# Patient Record
Sex: Male | Born: 1955 | Race: White | Hispanic: No | Smoking: Never smoker
Health system: Southern US, Community
[De-identification: ages and names within clinical notes are randomized; demographics above are authoritative.]

## PROBLEM LIST (undated history)

## (undated) DIAGNOSIS — I2699 Other pulmonary embolism without acute cor pulmonale: Secondary | ICD-10-CM

## (undated) DIAGNOSIS — F419 Anxiety disorder, unspecified: Secondary | ICD-10-CM

## (undated) DIAGNOSIS — Z889 Allergy status to unspecified drugs, medicaments and biological substances status: Secondary | ICD-10-CM

## (undated) DIAGNOSIS — I10 Essential (primary) hypertension: Secondary | ICD-10-CM

## (undated) HISTORY — PX: MOUTH SURGERY: SHX715

---
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
## 2007-09-05 ENCOUNTER — Ambulatory Visit: Payer: Self-pay | Admitting: Cardiology

## 2010-11-14 NOTE — Letter (Signed)
September 05, 2007    Dr. Charlesetta Shanks   RE:  AYVIN, LIPINSKI  MRN:  [id number]  /  DOB:  Feb 20, 1956   Dear Dr. Celene Skeen:   Thank you for your referral of Mr. Tagg.  He is a 55 year old male  with a history of possible polymyalgia rheumatica presently being  treated with prednisone.  He has no personal history of cardiovascular  disease, hypertension or type 2 diabetes mellitus, but does report a  problem with borderline cholesterol elevation which he has been trying  to manage via diet.  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  He developed a flu-like illness  associated with dizziness which sounded fairly orthostatic based on his  description.  A few weeks later when he was walking his wife to work,  approximately 400-500 yards, he began to experience a chest ache also  associated with milder dizziness and cold sweats.  He had symptoms  intermittently for 1-2 hours and then this resolved spontaneously.  He  did not seek medical attention at that time.  Subsequent to this he has  noted intermittently dyspnea on exertion somewhat out of proportion to  activity, typically with walking or going up steps.  He has had no chest  pain, however.  He is referred to [place] today discuss the matter further.  I note electrocardiogram done recently which does not show any evidence  of transmural infarction.  He had one tracing showing a single premature  atrial complex.  He has never undergone any prior ischemic evaluation.   ALLERGIES:  AMPICILLIN   Present medications include prednisone 10 mg p.o. daily and over-the-  counter antihistamines preparation.   PAST MEDICAL HISTORY:  As discussed above.  Patient is status post  vasectomy in 2007 and prior knee surgery in 1973.  He also has problems  with chronic left rotator cuff pain.   SOCIAL HISTORY:  The patient is married.  He has two stepchildren.  He  denies any tobacco use.  Drinks  alcohol occasionally.  No regular  exercise regimen at this time.  He is a Community education officer at  [employer].   FAMILY HISTORY:  Was reviewed and is noncontributory for premature  cardiovascular disease.  The patient states his mother died at age 76  and with history of breast cancer and father died at age 76 with  leukemia.   REVIEW OF SYSTEMS:  As outlined above.  He has problems with seasonal  allergies.  Muscle and joint stiffness also described.  No fevers or  chills.  No orthopnea, palpitations or syncope.  Remainder of systems  are negative.   EXAMINATION:  Blood pressure is 118/74 at rest, heart rate 89.  The  patient was checked and found not to be orthostatic based on blood  pressure response.  Heart rate increased somewhat into the high 90s to  low 100 range but he was asymptomatic.  He is normally nourished  appearing, no acute distress.  HEENT:  Conjunctiva, lids normal.  Pharynx clear.  NECK is supple.  No elevated jugular venous pressure, no bruits, no  thyromegaly is noted.  LUNGS:  Clear, without labored breathing at rest.  CARDIAC exam reveals a regular rate and rhythm.  No S3 gallop or  pericardial rub.  ABDOMEN:  Soft, nontender, normoactive bowel sounds.  EXTREMITIES:  Exhibit no significant pitting edema.  Distal pulses are  2+.  SKIN:  Warm  and dry.  MUSCULOSKELETAL:  No kyphosis is noted.  NEUROPSYCHIATRIC:  The patient is alert and oriented x3.  Affect is  normal.   IMPRESSION AND RECOMMENDATIONS:  Mr. Star is a 55 year old male with  reported borderline hypercholesterolemia who experienced an episode of  exertional chest discomfort and subsequently a feeling of relative  dyspnea on exertion.  He also had a flu-like illness proceeding these  symptoms back in February.  He has undergone no prior cardiac risk  stratification.  His electrocardiogram at rest is nonspecific.  We  discussed the issues today and I have recommended an exercise   echocardiogram to both exclude cardiomyopathy and also evaluate for  potential ischemia.  We will plan to contact him with the results by  phone.  If any significant abnormalities are uncovered we will bring him  back to discuss further testing.    Sincerely,     Jonelle Sidle, MD  Electronically Signed   SGM/MedQ  DD: 09/05/2007  DT: 09/06/2007  Job #: [id number]

## 2015-10-31 DIAGNOSIS — I2699 Other pulmonary embolism without acute cor pulmonale: Secondary | ICD-10-CM

## 2015-10-31 HISTORY — DX: Other pulmonary embolism without acute cor pulmonale: I26.99

## 2016-11-07 ENCOUNTER — Emergency Department (HOSPITAL_COMMUNITY): Payer: 59

## 2016-11-07 ENCOUNTER — Encounter (HOSPITAL_COMMUNITY): Payer: Self-pay | Admitting: Emergency Medicine

## 2016-11-07 ENCOUNTER — Inpatient Hospital Stay (HOSPITAL_COMMUNITY)
Admission: EM | Admit: 2016-11-07 | Discharge: 2016-11-09 | DRG: 176 | Disposition: A | Discharge status: Home / Self Care | Payer: 59 | Attending: Family Medicine | Admitting: Family Medicine

## 2016-11-07 DIAGNOSIS — Z881 Allergy status to other antibiotic agents status: Secondary | ICD-10-CM

## 2016-11-07 DIAGNOSIS — Z6826 Body mass index (BMI) 26.0-26.9, adult: Secondary | ICD-10-CM

## 2016-11-07 DIAGNOSIS — Z79899 Other long term (current) drug therapy: Secondary | ICD-10-CM

## 2016-11-07 DIAGNOSIS — F419 Anxiety disorder, unspecified: Secondary | ICD-10-CM | POA: Diagnosis present

## 2016-11-07 DIAGNOSIS — R0902 Hypoxemia: Secondary | ICD-10-CM | POA: Diagnosis present

## 2016-11-07 DIAGNOSIS — S2231XA Fracture of one rib, right side, initial encounter for closed fracture: Secondary | ICD-10-CM | POA: Diagnosis present

## 2016-11-07 DIAGNOSIS — R634 Abnormal weight loss: Secondary | ICD-10-CM | POA: Diagnosis present

## 2016-11-07 DIAGNOSIS — R000 Tachycardia, unspecified: Secondary | ICD-10-CM

## 2016-11-07 DIAGNOSIS — I2699 Other pulmonary embolism without acute cor pulmonale: Principal | ICD-10-CM | POA: Diagnosis present

## 2016-11-07 DIAGNOSIS — Z882 Allergy status to sulfonamides status: Secondary | ICD-10-CM

## 2016-11-07 DIAGNOSIS — X58XXXA Exposure to other specified factors, initial encounter: Secondary | ICD-10-CM | POA: Diagnosis present

## 2016-11-07 DIAGNOSIS — R0600 Dyspnea, unspecified: Secondary | ICD-10-CM | POA: Diagnosis not present

## 2016-11-07 DIAGNOSIS — R0609 Other forms of dyspnea: Secondary | ICD-10-CM | POA: Diagnosis not present

## 2016-11-07 DIAGNOSIS — I10 Essential (primary) hypertension: Secondary | ICD-10-CM | POA: Diagnosis present

## 2016-11-07 HISTORY — DX: Allergy status to unspecified drugs, medicaments and biological substances: Z88.9

## 2016-11-07 HISTORY — DX: Essential (primary) hypertension: I10

## 2016-11-07 HISTORY — DX: Other pulmonary embolism without acute cor pulmonale: I26.99

## 2016-11-07 HISTORY — DX: Anxiety disorder, unspecified: F41.9

## 2016-11-07 LAB — CBC WITH DIFFERENTIAL/PLATELET
BASOS PCT: 1 %
Basophils Absolute: 0.1 10*3/uL (ref 0.0–0.1)
Eosinophils Absolute: 1 10*3/uL — ABNORMAL HIGH (ref 0.0–0.7)
Eosinophils Relative: 8 %
HEMATOCRIT: 43.3 % (ref 39.0–52.0)
HEMOGLOBIN: 15.4 g/dL (ref 13.0–17.0)
LYMPHS ABS: 1.5 10*3/uL (ref 0.7–4.0)
Lymphocytes Relative: 13 %
MCH: 31.3 pg (ref 26.0–34.0)
MCHC: 35.6 g/dL (ref 30.0–36.0)
MCV: 88 fL (ref 78.0–100.0)
MONO ABS: 0.6 10*3/uL (ref 0.1–1.0)
MONOS PCT: 5 %
NEUTROS ABS: 8.6 10*3/uL — AB (ref 1.7–7.7)
NEUTROS PCT: 73 %
Platelets: 275 10*3/uL (ref 150–400)
RBC: 4.92 MIL/uL (ref 4.22–5.81)
RDW: 13 % (ref 11.5–15.5)
WBC: 11.8 10*3/uL — ABNORMAL HIGH (ref 4.0–10.5)

## 2016-11-07 LAB — HEPARIN LEVEL (UNFRACTIONATED): Heparin Unfractionated: 0.75 IU/mL — ABNORMAL HIGH (ref 0.30–0.70)

## 2016-11-07 LAB — COMPREHENSIVE METABOLIC PANEL
ALK PHOS: 65 U/L (ref 38–126)
ALT: 18 U/L (ref 17–63)
AST: 22 U/L (ref 15–41)
Albumin: 4.1 g/dL (ref 3.5–5.0)
Anion gap: 7 (ref 5–15)
BILIRUBIN TOTAL: 1.1 mg/dL (ref 0.3–1.2)
BUN: 13 mg/dL (ref 6–20)
CO2: 28 mmol/L (ref 22–32)
CREATININE: 1.2 mg/dL (ref 0.61–1.24)
Calcium: 9.1 mg/dL (ref 8.9–10.3)
Chloride: 102 mmol/L (ref 101–111)
GFR calc non Af Amer: >60 mL/min (ref >60)
GLUCOSE: 117 mg/dL — AB (ref 65–99)
Potassium: 4.4 mmol/L (ref 3.5–5.1)
Sodium: 137 mmol/L (ref 135–145)
Total Protein: 7 g/dL (ref 6.5–8.1)

## 2016-11-07 LAB — TSH: TSH: 0.431 u[IU]/mL (ref 0.350–4.500)

## 2016-11-07 LAB — I-STAT TROPONIN, ED: Troponin i, poc: 0.01 ng/mL (ref 0.00–0.08)

## 2016-11-07 LAB — MAGNESIUM: Magnesium: 2.2 mg/dL (ref 1.7–2.4)

## 2016-11-07 LAB — MRSA PCR SCREENING: MRSA by PCR: NEGATIVE

## 2016-11-07 LAB — BRAIN NATRIURETIC PEPTIDE: B NATRIURETIC PEPTIDE 5: 39.7 pg/mL (ref 0.0–100.0)

## 2016-11-07 LAB — RAPID URINE DRUG SCREEN, HOSP PERFORMED
Amphetamines: NOT DETECTED
Barbiturates: NOT DETECTED
Benzodiazepines: NOT DETECTED
COCAINE: NOT DETECTED
OPIATES: POSITIVE — AB
Tetrahydrocannabinol: NOT DETECTED

## 2016-11-07 LAB — PHOSPHORUS: PHOSPHORUS: 2.5 mg/dL (ref 2.5–4.6)

## 2016-11-07 LAB — STREP PNEUMONIAE URINARY ANTIGEN: STREP PNEUMO URINARY ANTIGEN: NEGATIVE

## 2016-11-07 MED ORDER — HEPARIN (PORCINE) IN NACL 100-0.45 UNIT/ML-% IJ SOLN
1500.0000 [IU]/h | INTRAMUSCULAR | Status: DC
  Administered 2016-11-07: 1600 [IU]/h via INTRAVENOUS
  Administered 2016-11-08: 1500 [IU]/h via INTRAVENOUS
  Filled 2016-11-07 (×2): qty 250

## 2016-11-07 MED ORDER — ONDANSETRON HCL 4 MG PO TABS: 4.0000 mg | ORAL_TABLET | Freq: Four times a day (QID) | ORAL | Status: DC | PRN

## 2016-11-07 MED ORDER — METHYLPREDNISOLONE SODIUM SUCC 125 MG IJ SOLR
60.0000 mg | Freq: Three times a day (TID) | INTRAMUSCULAR | Status: DC
  Administered 2016-11-07 – 2016-11-08 (×3): 60 mg via INTRAVENOUS
  Filled 2016-11-07 (×3): qty 2

## 2016-11-07 MED ORDER — METHYLPREDNISOLONE SODIUM SUCC 125 MG IJ SOLR
125.0000 mg | Freq: Once | INTRAMUSCULAR | Status: AC
  Administered 2016-11-07: 125 mg via INTRAVENOUS
  Filled 2016-11-07: qty 2

## 2016-11-07 MED ORDER — ONDANSETRON HCL 4 MG/2ML IJ SOLN: 4.0000 mg | Freq: Four times a day (QID) | INTRAMUSCULAR | Status: DC | PRN

## 2016-11-07 MED ORDER — IPRATROPIUM-ALBUTEROL 0.5-2.5 (3) MG/3ML IN SOLN
3.0000 mL | RESPIRATORY_TRACT | Status: DC | PRN
  Administered 2016-11-07 – 2016-11-08 (×2): 3 mL via RESPIRATORY_TRACT
  Filled 2016-11-07 (×2): qty 3

## 2016-11-07 MED ORDER — HEPARIN BOLUS VIA INFUSION
5500.0000 [IU] | Freq: Once | INTRAVENOUS | Status: AC
  Administered 2016-11-07: 5500 [IU] via INTRAVENOUS
  Filled 2016-11-07: qty 5500

## 2016-11-07 MED ORDER — LORAZEPAM 2 MG/ML IJ SOLN
1.0000 mg | Freq: Once | INTRAMUSCULAR | Status: AC
  Administered 2016-11-07: 1 mg via INTRAVENOUS
  Filled 2016-11-07: qty 1

## 2016-11-07 MED ORDER — SODIUM CHLORIDE 0.9 % IV BOLUS (SEPSIS)
1000.0000 mL | Freq: Once | INTRAVENOUS | Status: AC
  Administered 2016-11-07: 1000 mL via INTRAVENOUS

## 2016-11-07 MED ORDER — BENZONATATE 100 MG PO CAPS
100.0000 mg | ORAL_CAPSULE | Freq: Three times a day (TID) | ORAL | Status: DC | PRN
  Administered 2016-11-07 – 2016-11-09 (×5): 100 mg via ORAL
  Filled 2016-11-07 (×5): qty 1

## 2016-11-07 MED ORDER — IPRATROPIUM-ALBUTEROL 0.5-2.5 (3) MG/3ML IN SOLN
3.0000 mL | Freq: Once | RESPIRATORY_TRACT | Status: AC
  Administered 2016-11-07: 3 mL via RESPIRATORY_TRACT
  Filled 2016-11-07: qty 3

## 2016-11-07 MED ORDER — ACETAMINOPHEN 325 MG PO TABS
650.0000 mg | ORAL_TABLET | Freq: Four times a day (QID) | ORAL | Status: DC | PRN
  Administered 2016-11-08: 650 mg via ORAL
  Filled 2016-11-07: qty 2

## 2016-11-07 MED ORDER — ALBUTEROL (5 MG/ML) CONTINUOUS INHALATION SOLN
10.0000 mg/h | INHALATION_SOLUTION | Freq: Once | RESPIRATORY_TRACT | Status: AC
  Administered 2016-11-07: 10 mg/h via RESPIRATORY_TRACT

## 2016-11-07 MED ORDER — ALBUTEROL (5 MG/ML) CONTINUOUS INHALATION SOLN
10.0000 mg/h | INHALATION_SOLUTION | Freq: Once | RESPIRATORY_TRACT | Status: AC
  Administered 2016-11-07: 10 mg/h via RESPIRATORY_TRACT
  Filled 2016-11-07: qty 20

## 2016-11-07 MED ORDER — IOPAMIDOL (ISOVUE-370) INJECTION 76%
INTRAVENOUS | Status: AC
  Administered 2016-11-07: 100 mL
  Filled 2016-11-07: qty 100

## 2016-11-07 MED ORDER — LEVOFLOXACIN IN D5W 750 MG/150ML IV SOLN
750.0000 mg | INTRAVENOUS | Status: DC
  Administered 2016-11-07 – 2016-11-08 (×2): 750 mg via INTRAVENOUS
  Filled 2016-11-07 (×2): qty 150

## 2016-11-07 MED ORDER — LORAZEPAM 1 MG PO TABS
1.0000 mg | ORAL_TABLET | Freq: Once | ORAL | Status: AC
  Administered 2016-11-07: 1 mg via ORAL
  Filled 2016-11-07: qty 1

## 2016-11-07 MED ORDER — ACETAMINOPHEN 650 MG RE SUPP: 650.0000 mg | Freq: Four times a day (QID) | RECTAL | Status: DC | PRN

## 2016-11-07 NOTE — Progress Notes (Signed)
ANTICOAGULATION CONSULT NOTE  Pharmacy Consult for heparin Indication: pulmonary embolus  Heparin Dosing Weight: 93 kg   Assessment: 60 yom presents with SOB. CTA concerning for small PE. Pharmacy consulted to start heparin. Not on anticoagulation PTA. CBC wnl, no bleed documented. Estimated weight in ED ~205 lbs per patient.  Goal of Therapy:  Heparin level 0.3-0.7 units/ml Monitor platelets by anticoagulation protocol: Yes   Plan:  Heparin 5500 unit bolus Start heparin at 1600 units/h 6h heparin level Daily heparin level/CBC Monitor s/sx bleeding   Tom [name], PharmD, BCPS Clinical Pharmacist 11/07/2016 4:10 PM

## 2016-11-07 NOTE — H&P (Signed)
Family Medicine Teaching [facility] [place] Admission History and Physical Service Pager: [phone]  Patient name: Tom [name] Medical record number: [id number] Date of birth: 11-Nov-1955 Age: 61 y.o. Gender: male  Primary Care Provider: Juanita Laster, MD Consultants: None  Code Status: Full  Chief Complaint: cough and SOB   Assessment and Plan: Tom [name] is a 61 y.o. male presenting with cough and SOB. PMH is significant for HTN.   Tachycardia likely 2/2 Acute PE: Vitals with tachycardia, tachypnea, currently on 2 Liters. CTA noted to have a clot in  lower lobe branch of right pulmonary artery concerning for small pulmonary embolus.  EKG with sinus tachycardia. Troponin in the ED 0.01. No recent hx of travel, immobilization. No hx of blood clots previously. - Admit to stepdown, attending Dr. Jennette Kettle  - Heparin per pharmacy for blood clot  - VSS per floor  - Given presence of PE and and fracture - hx of cancer screening   Cough: Recent history of cough, congestion; treated for CAP with outpatient treatment with antibiotics. Patient continuing to have cough and congestion. Noted to have some chills. WBC of 11.8. Previously has received Z-Pak and clindamycin outpatient. Physical exam with wheezing and decreased bibasilar sounds, received CAT x 1 hr in the ED and Solumedrol 125 mg. No hx of smoking, COPD, and asthma  - Blood cultures 2 - Obtain urine Legionella and urine strep pneumonia  - Duonebs PRN q4 hours  - CAT x 1  - Albuterol PRN q2 hours  - Start Levaquin for CAP  - Noted to have weight loss/tachycardia - obtain TSH  Mildly displaced right sixth rib fracture: Denies any significant pain  - Tylenol as needed   HTN: Hold Losartan for now  - Consider restarting tomorrow    Anxiety: Patient is very anxious. Denies any psychiatric hx of anxiety or depression.  - Will provide Ativan 0.5 mg x1  - Consider GAD-7 vs PHQ-9 - UDS  FEN/GI: Regular diet  Prophylaxis: Heparin    Disposition: Home   History of Present Illness:  Tom [name] is a 61 y.o. male presenting with SOB.  Patient states about 2 weeks ago he developed URI symptoms with cough and congestion. He went to see his doctor and was given a Z-Pak. He then continued to have persistent violent coughing over the next week. He also developed significant pain in his chest, most painful thing is ever felt. He states that he received clindamycin when he went back to his doctor the following week. On Sunday night he developed shortness of breath. 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 He continued to have shortness of breath although he states that the pain in his rib had improved some. He decided to come in to the ED today to be evaluated. Patient is any history of cancer, leg swelling, no surgeries, no personal history of blood clots, no recent history of immobilization (drive to [place] in February). Denies any fevers, indicates having chills. Endorse a 7 lbs weight loss in past 2 week, however he believes this is due to his lack of appetite from coughing. Patient is not a smoker and has never been. He denies any hx of COPD or asthma.   Review Of Systems: Per HPI with the following additions:   Review of Systems  Constitutional: Negative for chills, fever and weight loss.  HENT: Negative  for congestion.   Eyes: Negative.   Respiratory: Positive for cough, sputum production, shortness of breath and wheezing.   Cardiovascular: Negative for chest pain and leg swelling.  Gastrointestinal: Negative for abdominal pain, blood in stool, nausea and vomiting.  Genitourinary: Negative for dysuria and hematuria.  Musculoskeletal: Negative for myalgias.  Skin: Negative.   Neurological: Negative for dizziness.  Psychiatric/Behavioral: Negative for depression. The patient is not nervous/anxious.     Patient Active Problem List   Diagnosis Date Noted  . Pulmonary embolism (HCC) 11/07/2016    Past Medical History: No past medical history on file.  Past Surgical History: Past Surgical History:  Procedure Laterality Date  . KNEE CARTILAGE SURGERY Right 1972    Social History: Social History  Substance Use Topics  . Smoking status: Not on file  . Smokeless tobacco: Not on file  . Alcohol use Not on file   Additional social history:  Please also refer to relevant sections of EMR.  Family History: No family history on file.  Allergies and Medications: Allergies  Allergen Reactions  . Ampicillin Other (See Comments)    Childhood; Unsure of reaction.  . Sulfa Antibiotics Other (See Comments)    unspecified   No current facility-administered medications on file prior to encounter.    No current outpatient prescriptions on file prior to encounter.    Objective: BP (!) 127/96   Pulse (!) 123   Temp 97.9 F (36.6 C) (Oral)   Resp (!) 26   Ht 6\' 1"  (1.854 m)   Wt 205 lb (93 kg)   SpO2 97%   BMI 27.05 kg/m  Exam: General: Lying in bed, NAD Eyes: PERRL  ENTM: Moist mucosa membranes,  Neck: No lymphadenopathy noted  Cardiovascular: tachycardia, no murmurs, no rubs, no gallops Respiratory: intermittent wheezing throughout, decreased bibasilar sounds  Gastrointestinal: BS+, no ttp, no distention  MSK: No lower extremity edema  Derm: No rashes or ulcerations noted  Neuro: Moving upper and lower extremities  Psych: Anxious, otherwise normal cognition   Labs and Imaging: CBC BMET   Recent Labs Lab 11/07/16 0935  WBC 11.8*  HGB 15.4  HCT 43.3  PLT 275    Recent Labs Lab 11/07/16 0935  NA 137  K 4.4  CL 102  CO2 28  BUN 13  CREATININE 1.20  GLUCOSE 117*  CALCIUM 9.1     EKG with sinus tachycardia   Berton BonMikell, Asiyah Zahra, MD 11/07/2016, 6:14 PM PGY-2, [facility] FPTS Intern pager: [phone], text pages welcome

## 2016-11-07 NOTE — ED Notes (Signed)
Returned from CT.

## 2016-11-07 NOTE — ED Notes (Signed)
Transported to CT via w/c

## 2016-11-07 NOTE — ED Triage Notes (Signed)
Pt presents to ED for shortness of breath and non productive cough that has been going on for months. Pt states it has been getting worse, pt has labored respirations with wheezing noted. Pt is alert and ox4, able to speak in complete sentences.

## 2016-11-07 NOTE — ED Provider Notes (Signed)
[facility]-EMERGENCY DEPT Provider Note   CSN: [id number] Arrival date & time: 11/07/16  [id number]     History   Chief Complaint Chief Complaint  Patient presents with  . Shortness of Breath    HPI Tom HanlyRobert [name] is a 61 y.o. male.  HPI    Tom HanlyRobert [name] is a 61 y.o. male, patient with no pertinent past medical history, presenting to the ED with shortness of breath and pain in the chest with coughing increasing over the past week. Since 5/6 he states, "I have felt like I just can't catch my breath."  He also endorses a nonproductive cough that has been worsening since March. States it initially felt like a cold. He has previously been placed on Tessalon and hydrocodone cough syrup.  Denies fever/chills, N/V/D, peripheral edema or unilateral leg swelling, rashes, or any other complaints.   Only recent travel was a three day conference in [place] at the end of Feb. Denies history of PE/DVT, recent trauma, or recent surgery.  Patient was seen in March by his PCP for this cough and diagnosed with URI. Cough worsened through April.  Patient was seen on May 3 by Dr. Fredderick SeveranceJohn Tipton. He was diagnosed with chronic sinusitis and prescribed a three-week course of Augmentin. He was switched from his lisinopril to losartan due to the possibility that his cough is being caused by the lisinopril. He was placed on Singulair as well as Flonase. Also diagnosed with a "pulled muscle" in the right lateral intercostal muscles.  Denies any known lung disease. Denies exposure to inhaled chemicals, work around asbestos, or work in mines.    No past medical history on file.  There are no active problems to display for this patient.   Past Surgical History:  Procedure Laterality Date  . KNEE CARTILAGE SURGERY Right 1972       Home Medications    Prior to Admission medications   Medication Sig Start Date End Date Taking? Authorizing Provider  chlorpheniramine (CHLOR-TRIMETON) 4 MG tablet Take 8 mg by  mouth at bedtime.   Yes [provider]  clindamycin (CLEOCIN) 300 MG capsule Take 300 mg by mouth 3 (three) times daily. 11/02/16  Yes [provider]  fluticasone (FLONASE) 50 MCG/ACT nasal spray Place 1 spray into both nostrils daily. 11/02/16  Yes [provider]  HYDROMET 5-1.5 MG/5ML syrup Take 5 mLs by mouth 3 (three) times daily. 11/02/16  Yes [provider]  loratadine (CLARITIN) 10 MG tablet Take 10 mg by mouth daily as needed for allergies.   Yes [provider]  losartan (COZAAR) 50 MG tablet Take 50 mg by mouth daily. 11/01/16  Yes [provider]  montelukast (SINGULAIR) 10 MG tablet Take 10 mg by mouth at bedtime. 11/01/16  Yes [provider]    Family History No family history on file.  Social History Social History  Substance Use Topics  . Smoking status: Not on file  . Smokeless tobacco: Not on file  . Alcohol use Not on file     Allergies   Ampicillin and Sulfa antibiotics   Review of Systems Review of Systems  Constitutional: Negative for chills and fever.  Respiratory: Positive for cough and shortness of breath.   Cardiovascular: Negative for leg swelling.  Gastrointestinal: Negative for abdominal pain, diarrhea, nausea and vomiting.  Skin: Negative for rash.  All other systems reviewed and are negative.    Physical Exam Updated Vital Signs BP 140/88   Pulse (!) 115   Resp (!)  30   SpO2 95%   Physical Exam  Constitutional: He appears well-developed and well-nourished. No distress.  HENT:  Head: Normocephalic and atraumatic.  Eyes: Conjunctivae are normal.  Neck: Neck supple.  Cardiovascular: Regular rhythm, normal heart sounds and intact distal pulses.  Tachycardia present.   Pulmonary/Chest: Tachypnea noted. He has decreased breath sounds.  Increased work of breathing. Speaks in full phrases, but has to pause and take a deep breath. Intermittently has to pause to rest to catch his breath.    Abdominal: Soft. There is no tenderness. There is no guarding.  Musculoskeletal: He exhibits no edema.  Lymphadenopathy:    He has no cervical adenopathy.  Neurological: He is alert.  Skin: Skin is warm and dry. He is not diaphoretic.  Psychiatric: He has a normal mood and affect. His behavior is normal.  Nursing note and vitals reviewed.    ED Treatments / Results  Labs (all labs ordered are listed, but only abnormal results are displayed) Labs Reviewed  COMPREHENSIVE METABOLIC PANEL - Abnormal; Notable for the following:       Result Value   Glucose, Bld 117 (*)    All other components within normal limits  CBC WITH DIFFERENTIAL/PLATELET - Abnormal; Notable for the following:    WBC 11.8 (*)    Neutro Abs 8.6 (*)    Eosinophils Absolute 1.0 (*)    All other components within normal limits  I-STAT TROPOININ, ED    EKG  EKG Interpretation  Date/Time:  Wednesday Nov 07 2016 08:41:52 EDT Ventricular Rate:  116 PR Interval:  158 QRS Duration: 84 QT Interval:  334 QTC Calculation: 464 R Axis:   91 Text Interpretation:  Sinus tachycardia Rightward axis Nonspecific ST abnormality Abnormal ECG Confirmed by Fayrene Fearing  MD, MARK ([id number]) on 11/07/2016 8:47:16 AM       Radiology Dg Chest 2 View  Result Date: 11/07/2016 CLINICAL DATA:  Shortness of breath, nonproductive cough. EXAM: CHEST  2 VIEW COMPARISON:  None. FINDINGS: The heart size and mediastinal contours are within normal limits. Both lungs are clear. No pneumothorax or pleural effusion is noted. The visualized skeletal structures are unremarkable. IMPRESSION: No active cardiopulmonary disease. Electronically Signed   By: Lupita Raider, M.D.   On: 11/07/2016 10:11   Ct Angio Chest Pe W Or Wo Contrast  Result Date: 11/07/2016 CLINICAL DATA:  Dyspnea, tachycardia. EXAM: CT ANGIOGRAPHY CHEST WITH CONTRAST TECHNIQUE: Multidetector CT imaging of the chest was performed using the standard protocol during bolus administration of  intravenous contrast. Multiplanar CT image reconstructions and MIPs were obtained to evaluate the vascular anatomy. CONTRAST:  100 mL of Isovue 370 intravenously. COMPARISON:  Radiographs of same day. FINDINGS: Cardiovascular: There appears to be a small filling defect in a lower lobe branch of the right pulmonary artery concerning for pulmonary embolus. No large central pulmonary embolus is noted. No evidence of thoracic aortic dissection or aneurysm is noted. Normal cardiac size. No pericardial effusion. Mediastinum/Nodes: No enlarged mediastinal, hilar, or axillary lymph nodes. Thyroid gland, trachea, and esophagus demonstrate no significant findings. Lungs/Pleura: Lungs are clear. No pleural effusion or pneumothorax. Upper Abdomen: No acute abnormality. Musculoskeletal: Mildly displaced right sixth rib fracture is noted. Review of the MIP images confirms the above findings. IMPRESSION: Acute mildly displaced right sixth rib fracture. Small filling defects seen in lower lobe branch of right pulmonary artery concerning for small pulmonary embolus. Critical Value/emergent results were called by telephone at the time of interpretation on 11/07/2016 at 3:14 pm  to Dr. Harolyn Rutherford , who verbally acknowledged these results. Electronically Signed   By: Lupita Raider, M.D.   On: 11/07/2016 15:15    Procedures Procedures (including critical care time)  Medications Ordered in ED Medications  ipratropium-albuterol (DUONEB) 0.5-2.5 (3) MG/3ML nebulizer solution 3 mL (3 mLs Nebulization Given 11/07/16 0946)  methylPREDNISolone sodium succinate (SOLU-MEDROL) 125 mg/2 mL injection 125 mg (125 mg Intravenous Given 11/07/16 1141)  albuterol (PROVENTIL,VENTOLIN) solution continuous neb (10 mg/hr Nebulization Given 11/07/16 1108)  iopamidol (ISOVUE-370) 76 % injection (100 mLs  Contrast Given 11/07/16 1430)  sodium chloride 0.9 % bolus 1,000 mL (1,000 mLs Intravenous New Bag/Given 11/07/16 1533)     Initial Impression /  Assessment and Plan / ED Course  I have reviewed the triage vital signs and the nursing notes.  Pertinent labs & imaging results that were available during my care of the patient were reviewed by me and considered in my medical decision making (see chart for details).  Clinical Course as of Nov 08 1551  Wed Nov 07, 2016  1000 Patient voices no change in shortness of breath with DuoNeb. Patient now has inspiratory and expiratory wheezes globally. Continues to be tachypneic and tachycardic.  [SJ]  1221 Patient is near the end of his 1 hour continuous albuterol nebulizer. States he "maybe" feels a decrease in shortness of breath. Still tachypneic and showing increased work of breathing.  [SJ]  1550 Spoke with Jae Dire, Family Medicine Resident, who agreed to admit the patient.  [SJ]    Clinical Course User Index [SJ] Ranesha Val C, PA-C     Patient presents for shortness of breath and continued cough. No definite risk factors for PE, however, patient is tachycardic, dyspneic, hypoxic, and tachypneic without previous history or definite source. Patient is afebrile and cough is nonproductive. I do not think this patient is low risk enough to qualify for d-dimer rule out. With 2 LPM [place] patient was noted to maintain 94-95%. Admitted to family medicine service.  Findings and plan of care discussed with Rolland Porter, MD.    Vitals:   11/07/16 1610 11/07/16 0915 11/07/16 0946 11/07/16 1039  BP:  103/88  128/86  Pulse:  (!) 109  (!) 112  Resp:  20  (!) 22  Temp:    97.9 F (36.6 C)  TempSrc:    Oral  SpO2: 95% 93% 93% 93%   Vitals:   11/07/16 1445 11/07/16 1500 11/07/16 1515 11/07/16 1530  BP: (!) 122/91 (!) 137/110 139/87 (!) 149/115  Pulse: (!) 110 (!) 114 (!) 122 (!) 116  Resp: 16 (!) 25 19 (!) 22  Temp:      TempSrc:      SpO2: 92% 94% 92% 92%     Final Clinical Impressions(s) / ED Diagnoses   Final diagnoses:  Dyspnea  Tachycardia  Other acute pulmonary embolism without acute cor  pulmonale (HCC)  Hypoxia    New Prescriptions New Prescriptions   No medications on file     Concepcion Living 11/07/16 1553    Rolland Porter, MD 11/07/16 1626

## 2016-11-07 NOTE — Progress Notes (Signed)
Pharmacy Antibiotic Note  Tom [name] is a 61 y.o. male admitted on 11/07/2016 with pneumonia.  Also with concern for PE. Pharmacy has been consulted for Levaquin dosing. Afebrile, WBC 11.8. SCr 1.2 on admit, CrCl~70-75.  Plan: Levaquin 750mg  IV q24h Monitor clinical progress, c/s, renal function F/u de-escalation plan/LOT   Height: 6\' 1"  (185.4 cm) Weight: 205 lb (93 kg) IBW/kg (Calculated) : 79.9  Temp (24hrs), Avg:97.9 F (36.6 C), Min:97.9 F (36.6 C), Max:97.9 F (36.6 C)   Recent Labs Lab 11/07/16 0935  WBC 11.8*  CREATININE 1.20    Estimated Creatinine Clearance: 73.1 mL/min (by C-G formula based on SCr of 1.2 mg/dL).    Allergies  Allergen Reactions  . Ampicillin Other (See Comments)    Childhood; Unsure of reaction.  . Sulfa Antibiotics Other (See Comments)    unspecified    Babs BertinHaley Modean [name], PharmD, BCPS Clinical Pharmacist 11/07/2016 5:20 PM

## 2016-11-07 NOTE — ED Notes (Signed)
Neb treatment still in use

## 2016-11-07 NOTE — ED Notes (Signed)
Pt taken to a treatment room. EKG given to MD.

## 2016-11-07 NOTE — Progress Notes (Signed)
ANTICOAGULATION CONSULT NOTE - Follow Up Consult  Pharmacy Consult for heparin Indication: pulmonary embolus  Allergies  Allergen Reactions  . Ampicillin Other (See Comments)    Childhood; Unsure of reaction.  . Sulfa Antibiotics Other (See Comments)    unspecified    Patient Measurements: Height: 6\' 1"  (185.4 cm) Weight: 199 lb 9.6 oz (90.5 kg) IBW/kg (Calculated) : 79.9 Heparin Dosing Weight: 90.5 Kg  Vital Signs: Temp: 98.7 F (37.1 C) (05/09 2053) Temp Source: Oral (05/09 2053) BP: 113/61 (05/09 2053) Pulse Rate: 130 (05/09 2053)  Labs:  Recent Labs  11/07/16 0935 11/07/16 2224  HGB 15.4  --   HCT 43.3  --   PLT 275  --   HEPARINUNFRC  --  0.75*  CREATININE 1.20  --     Estimated Creatinine Clearance: 73.1 mL/min (by C-G formula based on SCr of 1.2 mg/dL).   Medications:  Prescriptions Prior to Admission  Medication Sig Dispense Refill Last Dose  . chlorpheniramine (CHLOR-TRIMETON) 4 MG tablet Take 8 mg by mouth at bedtime.   Past Week at Unknown time  . clindamycin (CLEOCIN) 300 MG capsule Take 300 mg by mouth 3 (three) times daily.   11/07/2016 at Unknown time  . fluticasone (FLONASE) 50 MCG/ACT nasal spray Place 1 spray into both nostrils daily.   Past Week at Unknown time  . HYDROMET 5-1.5 MG/5ML syrup Take 5 mLs by mouth 3 (three) times daily.   11/06/2016 at Unknown time  . loratadine (CLARITIN) 10 MG tablet Take 10 mg by mouth daily as needed for allergies.   Past Week at Unknown time  . losartan (COZAAR) 50 MG tablet Take 50 mg by mouth daily.   11/06/2016 at Unknown time  . montelukast (SINGULAIR) 10 MG tablet Take 10 mg by mouth at bedtime.   11/06/2016 at Unknown time   Assessment: [age] yom presents with SOB, CTA concerning for small PE. Pharmacy consulted to start heparin. Not on anticoagulation PTA. CBC wnl, no bleeding or infusion related issues reported.   Heparin level: 0.75  Goal of Therapy:  Heparin level 0.3-0.7 units/ml Monitor platelets by  anticoagulation protocol: Yes   Plan:  Reduce heparin to 1500 units/h 6h heparin level Daily heparin level/CBC Monitor s/sx bleeding  Tom [name] Tom [name] 11/07/2016,11:18 PM

## 2016-11-07 NOTE — ED Notes (Signed)
Pt was able to stand beside bed to use urinal.

## 2016-11-08 ENCOUNTER — Encounter (HOSPITAL_COMMUNITY): Payer: Self-pay | Admitting: General Practice

## 2016-11-08 DIAGNOSIS — R0600 Dyspnea, unspecified: Secondary | ICD-10-CM

## 2016-11-08 LAB — CBC
HCT: 40.1 % (ref 39.0–52.0)
Hemoglobin: 13.9 g/dL (ref 13.0–17.0)
MCH: 30.4 pg (ref 26.0–34.0)
MCHC: 34.7 g/dL (ref 30.0–36.0)
MCV: 87.7 fL (ref 78.0–100.0)
PLATELETS: 294 10*3/uL (ref 150–400)
RBC: 4.57 MIL/uL (ref 4.22–5.81)
RDW: 13.3 % (ref 11.5–15.5)
WBC: 19.5 10*3/uL — ABNORMAL HIGH (ref 4.0–10.5)

## 2016-11-08 LAB — COMPREHENSIVE METABOLIC PANEL
ALT: 18 U/L (ref 17–63)
AST: 25 U/L (ref 15–41)
Albumin: 3.9 g/dL (ref 3.5–5.0)
Alkaline Phosphatase: 56 U/L (ref 38–126)
Anion gap: 11 (ref 5–15)
BUN: 20 mg/dL (ref 6–20)
CHLORIDE: 103 mmol/L (ref 101–111)
CO2: 21 mmol/L — ABNORMAL LOW (ref 22–32)
Calcium: 8.9 mg/dL (ref 8.9–10.3)
Creatinine, Ser: 1.36 mg/dL — ABNORMAL HIGH (ref 0.61–1.24)
GFR, EST NON AFRICAN AMERICAN: 55 mL/min — AB (ref >60)
Glucose, Bld: 165 mg/dL — ABNORMAL HIGH (ref 65–99)
POTASSIUM: 3.7 mmol/L (ref 3.5–5.1)
Sodium: 135 mmol/L (ref 135–145)
Total Bilirubin: 0.7 mg/dL (ref 0.3–1.2)
Total Protein: 6.4 g/dL — ABNORMAL LOW (ref 6.5–8.1)

## 2016-11-08 LAB — HEPARIN LEVEL (UNFRACTIONATED): Heparin Unfractionated: 0.6 IU/mL (ref 0.30–0.70)

## 2016-11-08 LAB — HIV ANTIBODY (ROUTINE TESTING W REFLEX): HIV Screen 4th Generation wRfx: NONREACTIVE

## 2016-11-08 LAB — T4, FREE: FREE T4: 1 ng/dL (ref 0.61–1.12)

## 2016-11-08 MED ORDER — LORAZEPAM 1 MG PO TABS
1.0000 mg | ORAL_TABLET | Freq: Four times a day (QID) | ORAL | Status: DC | PRN
  Administered 2016-11-08: 1 mg via ORAL
  Filled 2016-11-08: qty 1

## 2016-11-08 MED ORDER — LOSARTAN POTASSIUM 50 MG PO TABS
50.0000 mg | ORAL_TABLET | Freq: Every day | ORAL | Status: DC
  Administered 2016-11-08 – 2016-11-09 (×2): 50 mg via ORAL
  Filled 2016-11-08 (×2): qty 1

## 2016-11-08 MED ORDER — LORAZEPAM 2 MG/ML IJ SOLN: 1.0000 mg | Freq: Four times a day (QID) | INTRAMUSCULAR | Status: DC | PRN

## 2016-11-08 MED ORDER — LORATADINE 10 MG PO TABS
10.0000 mg | ORAL_TABLET | Freq: Every day | ORAL | Status: DC | PRN
  Administered 2016-11-08 – 2016-11-09 (×2): 10 mg via ORAL
  Filled 2016-11-08 (×2): qty 1

## 2016-11-08 MED ORDER — PREDNISONE 20 MG PO TABS: 40.0000 mg | ORAL_TABLET | Freq: Every day | ORAL | Status: DC

## 2016-11-08 MED ORDER — RIVAROXABAN 20 MG PO TABS: 20.0000 mg | ORAL_TABLET | Freq: Every day | ORAL | Status: DC

## 2016-11-08 MED ORDER — RIVAROXABAN 15 MG PO TABS
15.0000 mg | ORAL_TABLET | Freq: Two times a day (BID) | ORAL | Status: DC
  Administered 2016-11-08 – 2016-11-09 (×3): 15 mg via ORAL
  Filled 2016-11-08 (×3): qty 1

## 2016-11-08 MED ORDER — FLUTICASONE PROPIONATE 50 MCG/ACT NA SUSP
1.0000 | Freq: Every day | NASAL | Status: DC
  Administered 2016-11-08 – 2016-11-09 (×2): 1 via NASAL
  Filled 2016-11-08: qty 16

## 2016-11-08 MED ORDER — RIVAROXABAN (XARELTO) EDUCATION KIT FOR DVT/PE PATIENTS
PACK | Freq: Once | Status: DC
  Filled 2016-11-08: qty 1

## 2016-11-08 NOTE — Progress Notes (Signed)
Family Medicine Teaching Service Daily Progress Note Intern Pager: [phone]  Patient name: Tom HanlyRobert [name] Medical record number: [id number] Date of birth: 1955-08-19 Age: 61 y.o. Gender: male  Primary Care Provider: Juanita Lasteripton, Tom S, MD Consultants: None Code Status: FULL   Pt Overview and Major Events to Date:  Admit 5/10  Assessment and Plan: Tom HanlyRobert Rundle is a 61 y.o. male presenting with cough and SOB.  PMH is significant for HTN.    Tachycardia likely 2/2 Acute PE: Tachycardia persistent overnight with HR in 120s-130s.  Initially requiring 2 L but has been weaned to room air with good O2 sats of 92-96%.  CTA notable for small R subsegmental PE and mildly displaced R 6th rib fracture likely 2/2 violent coughing.  EKG with sinus tachycardia. Troponin in the ED 0.01. No recent hx of travel, immobilization. No hx of blood clots previously.  -Patient receiving treatment dose Heparin per pharmacy.  Will transition to Xarelto this AM.   -Pharmacy to perform Xarelto teaching, appreciate assistance  -vitals per unit routine  Cough likely 2/2 CAP: Patient continuing to have cough and congestion. Has been treated for CAP outpatient with antibiotics. WBC of 11.8 and afebrile on arrival to ED. Previously has received Z-Pak and clindamycin outpatient with no improvement.  Physical exam with diffuse wheezing and decreased bibasilar sounds, received CAT x 1 hr in the ED and Solumedrol 125 mg. No hx of smoking, COPD, asthma or other underlying lung disease.   Urine strep pneumonia negative.  - Blood cultures pending  - Urine Legionella pending  - Duonebs PRN q4 hours  - Albuterol PRN q2 hours  - Continue Levaquin (Day 2)  for CAP  - Will discontinue steroids this AM  - Noted to have weight loss/tachycardia - TSH wnl  - Home Claritin and Flonase restarted this AM,  per patient request.   Mildly displaced right sixth rib fracture: Likely 2/2 violent coughing.  Denies history of fall.  Denies any  significant pain.  - Tylenol as needed   HTN: Normotensive.  BP this AM 124/67. At home on Losartan 50 mg daily.  - Will restart home Losartan  Anxiety: Patient is very anxious. Denies any psychiatric history of anxiety or depression. UDS positive for opiates however this may be due to medication he takes at home.  - Add Lorazepam prn for anxiety  - Consider outpatient workup for possible bipolar vs generalized anxiety disorder  FEN/GI: Regular diet  Prophylaxis: Heparin > Xarelto  Disposition: dispo pending clinical improvement    Subjective:  Patient overly anxious, wanting to discuss every symptom in detail.  Feels like his breathing overall improved however coughing fits are very bothersome to him.  Has remained afebrile overnight.  Requesting his home Loratidine this morning as he feels this helps him with his allergy symptoms. Feels as though duonebs only provide a small improvement in symptoms.  Does not like using flonase as he feels It drips to the back of his throat.  Discussed proper administration of the medication with him and to aim it towards ears in either nostril.    Objective: Temp:  [97.7 F (36.5 C)-98.7 F (37.1 C)] 97.7 F (36.5 C) (05/10 0742) Pulse Rate:  [106-134] 106 (05/10 0742) Resp:  [0-32] 22 (05/10 0742) BP: (113-161)/(38-115) 124/67 (05/10 0742) SpO2:  [91 %-100 %] 96 % (05/10 0742) Weight:  [199 lb 9.6 oz (90.5 kg)-205 lb (93 kg)] 199 lb 9.6 oz (90.5 kg) (05/09 1800) Physical Exam: General: Anxious appearing, 60yo M  lying in bed  Eyes: PERRL, EOMI  ENTM: MMM, o/p clear  Neck: supple no LAD  Cardiovascular: tachycardic no MRG noted, palpable pulses Respiratory: decreased bibasilar breath sounds, normal work of breathing on RA Gastrointestinal: soft, NTND, +bs MSK: TTP over Right 6th rib, no LE edema noted  Neuro: AOx3, no focal deficits  Psych: Extremely anxious  Laboratory:  Recent Labs Lab 11/07/16 0935 11/08/16 0347  WBC 11.8* 19.5*   HGB 15.4 13.9  HCT 43.3 40.1  PLT 275 294    Recent Labs Lab 11/07/16 0935 11/08/16 0347  NA 137 135  K 4.4 3.7  CL 102 103  CO2 28 21*  BUN 13 20  CREATININE 1.20 1.36*  CALCIUM 9.1 8.9  PROT 7.0 6.4*  BILITOT 1.1 0.7  ALKPHOS 65 56  ALT 18 18  AST 22 25  GLUCOSE 117* 165*    Imaging/Diagnostic Tests: Ct Angio Chest Pe W Or Wo Contrast  Result Date: 11/07/2016 CLINICAL DATA:  Dyspnea, tachycardia. EXAM: CT ANGIOGRAPHY CHEST WITH CONTRAST TECHNIQUE: Multidetector CT imaging of the chest was performed using the standard protocol during bolus administration of intravenous contrast. Multiplanar CT image reconstructions and MIPs were obtained to evaluate the vascular anatomy. CONTRAST:  100 mL of Isovue 370 intravenously. COMPARISON:  Radiographs of same day. FINDINGS: Cardiovascular: There appears to be a small filling defect in a lower lobe branch of the right pulmonary artery concerning for pulmonary embolus. No large central pulmonary embolus is noted. No evidence of thoracic aortic dissection or aneurysm is noted. Normal cardiac size. No pericardial effusion. Mediastinum/Nodes: No enlarged mediastinal, hilar, or axillary lymph nodes. Thyroid gland, trachea, and esophagus demonstrate no significant findings. Lungs/Pleura: Lungs are clear. No pleural effusion or pneumothorax. Upper Abdomen: No acute abnormality. Musculoskeletal: Mildly displaced right sixth rib fracture is noted. Review of the MIP images confirms the above findings. IMPRESSION: Acute mildly displaced right sixth rib fracture. Small filling defects seen in lower lobe branch of right pulmonary artery concerning for small pulmonary embolus. Critical Value/emergent results were called by telephone at the time of interpretation on 11/07/2016 at 3:14 pm to Dr. Harolyn Rutherford , who verbally acknowledged these results. Electronically Signed   By: Lupita Raider, M.D.   On: 11/07/2016 15:15   Freddrick March, MD 11/08/2016, 11:10  AM PGY-1, [facility] Family Medicine FPTS Intern pager: [phone], text pages welcome

## 2016-11-08 NOTE — Care Management Note (Addendum)
Case Management Note  Patient Details  Name: Francena HanlyRobert Fancher MRN: [id number] Date of Birth: 1956/03/09  Subjective/Objective:  Pt presented for SOB- positive for Pulmonary Embolism. Plan will be to d/c on Xarelto.                    Action/Plan: Benefits Check completed for Xarelto. CM will provide pt with 30 day free and co pay card. [facility] and medication starter pack is not available. Pt is aware to o to [facility] on [address].  S/W TRE  @ [employer] # [phone]   1. XARELTO 15 MG BID  COVER- YES  CO-PAY- $ 45.00   Q/L 1 PER DAY  TIER- 2 DRUG  PRIOR APPROVAL- YES # [phone]   2. XARELTO  20 DAILY  COVER- YES  CO-PAY- $ 45.00  Q/L 1 PER DAY  TIER- 2 DRUG  PRIOR APPROVAL- NO   PHARMACY : [facility]   Expected Discharge Date:                  Expected Discharge Plan:  Home/Self Care  In-House Referral:  NA  Discharge planning Services  CM Consult, Medication Assistance  Post Acute Care Choice:  NA Choice offered to:  NA  DME Arranged:  N/A DME Agency:  NA  HH Arranged:  NA HH Agency:  NA  Status of Service:  Completed, signed off  If discussed at Long Length of Stay Meetings, dates discussed:    Additional Comments:  Gala LewandowskyGraves-Bigelow, Ezzie Senat Kaye, RN 11/08/2016, 3:51 PM

## 2016-11-08 NOTE — Progress Notes (Signed)
ANTICOAGULATION CONSULT NOTE - Follow Up Consult  Pharmacy Consult for heparin Indication: pulmonary embolus  Allergies  Allergen Reactions  . Ampicillin Other (See Comments)    Childhood; Unsure of reaction.  . Sulfa Antibiotics Other (See Comments)    unspecified    Patient Measurements: Height: 6\' 1"  (185.4 cm) Weight: 199 lb 9.6 oz (90.5 kg) IBW/kg (Calculated) : 79.9 Heparin Dosing Weight: 90.5 Kg  Vital Signs: Temp: 97.7 F (36.5 C) (05/10 0742) Temp Source: Oral (05/10 0742) BP: 124/67 (05/10 0742) Pulse Rate: 106 (05/10 0742)  Labs:  Recent Labs  11/07/16 0935 11/07/16 2224 11/08/16 0347 11/08/16 0528  HGB 15.4  --  13.9  --   HCT 43.3  --  40.1  --   PLT 275  --  294  --   HEPARINUNFRC  --  0.75*  --  0.60  CREATININE 1.20  --  1.36*  --     Estimated Creatinine Clearance: 64.5 mL/min (A) (by C-G formula based on SCr of 1.36 mg/dL (H)).   Medications:  Prescriptions Prior to Admission  Medication Sig Dispense Refill Last Dose  . chlorpheniramine (CHLOR-TRIMETON) 4 MG tablet Take 8 mg by mouth at bedtime.   Past Week at Unknown time  . clindamycin (CLEOCIN) 300 MG capsule Take 300 mg by mouth 3 (three) times daily.   11/07/2016 at Unknown time  . fluticasone (FLONASE) 50 MCG/ACT nasal spray Place 1 spray into both nostrils daily.   Past Week at Unknown time  . HYDROMET 5-1.5 MG/5ML syrup Take 5 mLs by mouth 3 (three) times daily.   11/06/2016 at Unknown time  . loratadine (CLARITIN) 10 MG tablet Take 10 mg by mouth daily as needed for allergies.   Past Week at Unknown time  . losartan (COZAAR) 50 MG tablet Take 50 mg by mouth daily.   11/06/2016 at Unknown time  . montelukast (SINGULAIR) 10 MG tablet Take 10 mg by mouth at bedtime.   11/06/2016 at Unknown time   Assessment: 89 yom presents with SOB, CTA concerning for small PE. Pharmacy consulted to start heparin. Not on anticoagulation PTA. CBC wnl, no overt s/s bleeding noted.  Heparin level: 0.6  Goal  of Therapy:  Heparin level 0.3-0.7 units/ml Monitor platelets by anticoagulation protocol: Yes   Plan:  Continue heparin at 1500 units/hr Daily heparin level/CBC Monitor s/s bleeding F/u long-term anticoagulation plan   York CeriseKatherine Cook, PharmD Pharmacy Resident  Pager [phone] 11/08/16 9:17 AM

## 2016-11-08 NOTE — Discharge Instructions (Signed)
Information on my medicine - XARELTO (rivaroxaban)  This medication education was reviewed with me or my healthcare representative as part of my discharge preparation.    WHY WAS XARELTO PRESCRIBED FOR YOU? Xarelto was prescribed to treat blood clots that may have been found in the veins of your legs (deep vein thrombosis) or in your lungs (pulmonary embolism) and to reduce the risk of them occurring again.  What do you need to know about Xarelto? The starting dose is one 15 mg tablet taken TWICE daily with food for the FIRST 21 DAYS then on 11/29/16 the dose is changed to one 20 mg tablet taken ONCE A DAY with your evening meal.  DO NOT stop taking Xarelto without talking to the health care provider who prescribed the medication.  Refill your prescription for 20 mg tablets before you run out.  After discharge, you should have regular check-up appointments with your healthcare provider that is prescribing your Xarelto.  In the future your dose may need to be changed if your kidney function changes by a significant amount.  What do you do if you miss a dose? If you are taking Xarelto TWICE DAILY and you miss a dose, take it as soon as you remember. You may take two 15 mg tablets (total 30 mg) at the same time then resume your regularly scheduled 15 mg twice daily the next day.  If you are taking Xarelto ONCE DAILY and you miss a dose, take it as soon as you remember on the same day then continue your regularly scheduled once daily regimen the next day. Do not take two doses of Xarelto at the same time.   Important Safety Information Xarelto is a blood thinner medicine that can cause bleeding. You should call your healthcare provider right away if you experience any of the following: ? Bleeding from an injury or your nose that does not stop. ? Unusual colored urine (red or dark brown) or unusual colored stools (red or black). ? Unusual bruising for unknown reasons. ? A serious fall or  if you hit your head (even if there is no bleeding).  Some medicines may interact with Xarelto and might increase your risk of bleeding while on Xarelto. To help avoid this, consult your healthcare provider or pharmacist prior to using any new prescription or non-prescription medications, including herbals, vitamins, non-steroidal anti-inflammatory drugs (NSAIDs) and supplements.  This website has more information on Xarelto: [email or web address].

## 2016-11-08 NOTE — Progress Notes (Signed)
ANTICOAGULATION CONSULT NOTE - Follow Up Consult  Pharmacy Consult :  Change from IV Heparin to Xarelto  Indication: pulmonary embolus  Allergies  Allergen Reactions  . Ampicillin Other (See Comments)    Childhood; Unsure of reaction.  . Sulfa Antibiotics Other (See Comments)    unspecified    Patient Measurements: Height: 6\' 1"  (185.4 cm) Weight: 199 lb 9.6 oz (90.5 kg) IBW/kg (Calculated) : 79.9 Heparin Dosing Weight: 90.5 Kg  Vital Signs: Temp: 97.7 F (36.5 C) (05/10 0742) Temp Source: Oral (05/10 0742) BP: 124/67 (05/10 0742) Pulse Rate: 106 (05/10 0742)  Labs:  Recent Labs  11/07/16 0935 11/07/16 2224 11/08/16 0347 11/08/16 0528  HGB 15.4  --  13.9  --   HCT 43.3  --  40.1  --   PLT 275  --  294  --   HEPARINUNFRC  --  0.75*  --  0.60  CREATININE 1.20  --  1.36*  --     Estimated Creatinine Clearance: 64.5 mL/min (A) (by C-G formula based on SCr of 1.36 mg/dL (H)).   Medications:  Prescriptions Prior to Admission  Medication Sig Dispense Refill Last Dose  . chlorpheniramine (CHLOR-TRIMETON) 4 MG tablet Take 8 mg by mouth at bedtime.   Past Week at Unknown time  . clindamycin (CLEOCIN) 300 MG capsule Take 300 mg by mouth 3 (three) times daily.   11/07/2016 at Unknown time  . fluticasone (FLONASE) 50 MCG/ACT nasal spray Place 1 spray into both nostrils daily.   Past Week at Unknown time  . HYDROMET 5-1.5 MG/5ML syrup Take 5 mLs by mouth 3 (three) times daily.   11/06/2016 at Unknown time  . loratadine (CLARITIN) 10 MG tablet Take 10 mg by mouth daily as needed for allergies.   Past Week at Unknown time  . losartan (COZAAR) 50 MG tablet Take 50 mg by mouth daily.   11/06/2016 at Unknown time  . montelukast (SINGULAIR) 10 MG tablet Take 10 mg by mouth at bedtime.   11/06/2016 at Unknown time   Assessment: Pharmacy received consult order to start Xarelto for PE in this 61 yo male who is currently on IV heparin infusion for PE.  Heparin level was therapeutic today as  previously note per pharmacist note this AM.  He wa not on anticoagulation PTA. CBC wnl, no overt s/s bleeding noted. CrCl is 64.5 ml/min    Goal of Therapy:  Monitor platelets by anticoagulation protocol: Yes   Plan:  Discontinue heparin drip now Start Xarelto now 15 mg BID x21 days then on 11/29/16 reduce dose to 20 mg daily with supper.  Monitor s/s bleeding Will educate patient about Xarelto prior to discharge  Noah Delaineuth Pahola Dimmitt, RPh Clinical Pharmacist Pager: [phone] #[id number] Main Pharmacy [phone] 11/08/16 10:57 AM

## 2016-11-09 LAB — COMPREHENSIVE METABOLIC PANEL
ALT: 23 U/L (ref 17–63)
AST: 40 U/L (ref 15–41)
Albumin: 3.7 g/dL (ref 3.5–5.0)
Alkaline Phosphatase: 56 U/L (ref 38–126)
Anion gap: 9 (ref 5–15)
BILIRUBIN TOTAL: 0.8 mg/dL (ref 0.3–1.2)
BUN: 28 mg/dL — AB (ref 6–20)
CO2: 25 mmol/L (ref 22–32)
CREATININE: 1.38 mg/dL — AB (ref 0.61–1.24)
Calcium: 9 mg/dL (ref 8.9–10.3)
Chloride: 102 mmol/L (ref 101–111)
GFR calc Af Amer: >60 mL/min (ref >60)
GFR, EST NON AFRICAN AMERICAN: 54 mL/min — AB (ref >60)
Glucose, Bld: 130 mg/dL — ABNORMAL HIGH (ref 65–99)
Potassium: 4.4 mmol/L (ref 3.5–5.1)
Sodium: 136 mmol/L (ref 135–145)
TOTAL PROTEIN: 6.2 g/dL — AB (ref 6.5–8.1)

## 2016-11-09 LAB — LEGIONELLA PNEUMOPHILA SEROGP 1 UR AG: L. PNEUMOPHILA SEROGP 1 UR AG: NEGATIVE

## 2016-11-09 LAB — CBC
HCT: 40.9 % (ref 39.0–52.0)
Hemoglobin: 13.9 g/dL (ref 13.0–17.0)
MCH: 30.9 pg (ref 26.0–34.0)
MCHC: 34 g/dL (ref 30.0–36.0)
MCV: 90.9 fL (ref 78.0–100.0)
PLATELETS: 270 10*3/uL (ref 150–400)
RBC: 4.5 MIL/uL (ref 4.22–5.81)
RDW: 14 % (ref 11.5–15.5)
WBC: 29 10*3/uL — AB (ref 4.0–10.5)

## 2016-11-09 LAB — T3, FREE: T3, Free: 3.1 pg/mL (ref 2.0–4.4)

## 2016-11-09 MED ORDER — BENZONATATE 100 MG PO CAPS: 100.0000 mg | ORAL_CAPSULE | Freq: Three times a day (TID) | ORAL | 0 refills | Status: DC | PRN

## 2016-11-09 MED ORDER — DIAZEPAM 5 MG PO TABS: 5.0000 mg | ORAL_TABLET | Freq: Two times a day (BID) | ORAL | 0 refills | Status: AC | PRN

## 2016-11-09 MED ORDER — RIVAROXABAN 15 MG PO TABS: 15.0000 mg | ORAL_TABLET | Freq: Two times a day (BID) | ORAL | 0 refills | Status: DC

## 2016-11-09 MED ORDER — LEVOFLOXACIN 750 MG PO TABS: 750.0000 mg | ORAL_TABLET | Freq: Every day | ORAL | Status: DC

## 2016-11-09 MED ORDER — LEVOFLOXACIN 750 MG PO TABS: 750.0000 mg | ORAL_TABLET | Freq: Every day | ORAL | 0 refills | Status: DC

## 2016-11-09 MED ORDER — RIVAROXABAN 20 MG PO TABS: 20.0000 mg | ORAL_TABLET | Freq: Every day | ORAL | 0 refills | Status: DC

## 2016-11-09 NOTE — Progress Notes (Signed)
Transitions of Care Pharmacy Note  Plan:  Educated on new Xarelto and Levaquin  Addressed concerns regarding OTC allergy medications  --------------------------------------------- Tom [name] is an 61 y.o. male who presents with a chief complaint cough/SOB. In anticipation of discharge, pharmacy has reviewed this patient's prior to admission medication history, as well as [facility]  Current medication indications, dosing, frequency, and notable side effects reviewed with patient. patient verbalized understanding of current inpatient medication regimen and is aware that the After Visit Summary when presented, will represent the most accurate medication list at discharge.   Tom Hanlyobert [name] expressed concerns regarding his OTC allergy medication regimen, as well as drug interactions with his new medications. I reassured the patient that we did not note any drug-drug interactions with his current regimen (to include Xarelto). Counseling and education materials for his new Xarelto were provided. We additionally discussed his new antibiotic Levaquin, and he is aware that he will need to complete this prescription in its entirety. The patient reports no further questions or concerns at the conclusion of our visit.    Assessment: Understanding of regimen: good Understanding of indications: good Potential of compliance: good Barriers to Obtaining Medications: No  Patient instructed to contact inpatient pharmacy team with further questions or concerns if needed.    Time spent preparing for discharge counseling: 15 min  Time spent counseling patient: 30 min    Tom [name], PharmD Pharmacy Resident  Pager [phone] 11/09/16 8:13 AM

## 2016-11-09 NOTE — Progress Notes (Signed)
Family Medicine Teaching Service Daily Progress Note Intern Pager: [phone]  Patient name: Tom HanlyRobert [name] Medical record number: [id number] Date of birth: [birth year] Age: 61 y.o. Gender: male  Primary Care Provider: Juanita [name], Tom [name] Consultants: None Code Status: FULL   Pt Overview and Major Events to Date:  Admit 5/10   Assessment and Plan: Tom [name] is a 61 y.o. male presenting with cough and SOB.  PMH is significant for HTN.    Tachycardia likely 2/2 Acute PE:  Tachycardia persistent overnight with HR in 120s-130s. Remained on RA overnight with stable O2 sats 92%. No recent hx of travel, immobilization. No hx of blood clots previously.   -Patient receiving treatment dose Heparin per pharmacy.  Transitioned to po Xarelto 5/10 and patient has received Xarelto teaching.   -vitals per unit routine  Cough likely 2/2 CAP: Patient c/o persistent dry cough.  Congestion has improved.  Lungs clear on exam with no wheezing noted.   No hx of smoking, COPD, asthma or other underlying lung disease.   Urine strep pneumonia negative.    - Blood cultures NG<24 h  - Pertussis pending  - Urine Legionella pending  - Continue Levaquin (Day 3)  for CAP >> transitioned to po Levaquin today for total course of 7 days on antibiotics  - Duonebs PRN q4 hours  - Albuterol PRN q2 hours   Mildly displaced right sixth rib fracture: Likely 2/2 violent coughing.  Denies history of fall.  Denies any significant pain.  - Tylenol as needed   Leukocytosis WBC 19.5 > 29.5.  Possibly due to steroid administration but were stopped yesterday. Patient afebrile and well appearing.    HTN: Normotensive.  BP this AM 122/79.  At home on Losartan 50 mg daily.  - Will restart home Losartan  Anxiety: Patient is very anxious. Denies any psychiatric history of anxiety or depression. UDS positive for opiates however this may be due to medication he takes at home.   - Lorazepam prn for anxiety  -Will discharge home  with prn Valium  - Consider outpatient workup for possible bipolar vs generalized anxiety disorder  FEN/GI: Regular diet  Prophylaxis: Xarelto   Disposition: Discharge home today.     Subjective:  Patient feels breathing is improved.  Complains of persistent dry cough.  States he has anxiety in certain situations and that precipitates his attacks of not being able to breathe.    Objective: Temp:  [97.5 F (36.4 C)-98.2 F (36.8 C)] 97.5 F (36.4 C) (05/11 0841) Pulse Rate:  [96-121] 113 (05/11 0841) Resp:  [22-29] 22 (05/11 0841) BP: (120-136)/(66-81) 136/66 (05/11 0841) SpO2:  [92 %-100 %] 100 % (05/11 0841) Weight:  [200 lb 9.6 oz (91 kg)] 200 lb 9.6 oz (91 kg) (05/11 0537) Physical Exam: General: 60yo M lying in bed, anxious appearing  Eyes: PERRL, EOMI  ENTM: MMM, o/p clear  Neck: supple  Cardiovascular: tachycardic, no MRG  Respiratory: CTA B/L,  normal work of breathing on RA  Gastrointestinal: soft, NTND, +bs  MSK: no edema or tenderness noted  Neuro: AOx3, no focal deficits  Psych: Extremely anxious  Laboratory:  Recent Labs Lab 11/07/16 0935 11/08/16 0347 11/09/16 0418  WBC 11.8* 19.5* 29.0*  HGB 15.4 13.9 13.9  HCT 43.3 40.1 40.9  PLT 275 294 270    Recent Labs Lab 11/07/16 0935 11/08/16 0347 11/09/16 0418  NA 137 135 136  K 4.4 3.7 4.4  CL 102 103 102  CO2 28 21* 25  BUN  13 20 28*  CREATININE 1.20 1.36* 1.38*  CALCIUM 9.1 8.9 9.0  PROT 7.0 6.4* 6.2*  BILITOT 1.1 0.7 0.8  ALKPHOS 65 56 56  ALT 18 18 23   AST 22 25 40  GLUCOSE 117* 165* 130*   Imaging/Diagnostic Tests: No results found. Freddrick March, [name] 11/09/2016, 11:58 AM PGY-1, [facility] Family Medicine FPTS Intern pager: [phone], text pages welcome

## 2016-11-11 LAB — BORDETELLA PERTUSSIS PCR
B parapertussis, DNA: NEGATIVE
B pertussis, DNA: NEGATIVE

## 2016-11-12 LAB — CULTURE, BLOOD (ROUTINE X 2)
CULTURE: NO GROWTH
Culture: NO GROWTH
Special Requests: ADEQUATE
Special Requests: ADEQUATE

## 2016-11-12 NOTE — Discharge Summary (Signed)
Family Medicine Teaching [facility] Discharge Summary  Patient name: Tom [name] Medical record number: [id number] Date of birth: September 08, 1955 Age: 61 y.o. Gender: male Date of Admission: 11/07/2016  Date of Discharge: 11/09/2016 Admitting Physician: Nestor Ramp, MD  Primary Care Provider: Juanita Laster, MD Consultants: None  Indication for Hospitalization: cough, shortness of breath   Discharge Diagnoses/Problem List:  Tachycardia Acute pulmonary embolism Cough Rib fracture HTN Anxiety  Disposition: Home  Discharge Condition: Stable, improved   Discharge Exam:  General: 60yo M lying in bed, NAD Eyes: PERRL, EOMI  ENTM: MMM, o/p clear  Neck: supple  Cardiovascular: tachycardic, no MRG  Respiratory: CTA B/L,  normal work of breathing on RA  Gastrointestinal: soft, NTND, +bs  MSK: no edema or tenderness noted  Neuro: AOx3, no focal deficits  Psych: appears anxious  Brief Hospital Course:  Patient presented to ED with tachycardia, tachypnea, and requiring 2L O2 initially.  CTA noted to have a clot in lower lobe branch of R pulmonary artery concerning for small pulmonary embolus.  EKG with sinus tachycardia.  Initial troponin 0.01.  No history of recent travel or immobilization and no prior history of clots. He was admitted to stepdown and started on heparin per pharmacy for treatment of PE.  Transitioned to Xarelto the next day and Xarelto teaching performed by pharmacy team.   Patient also complaining of cough and congestion on admission and had been treated outpatient for CAP however without improvement with Azithromycin and Clindamycin.  WBC of 11.8 and afebrile in the ED. Physical exam with wheezing and decreased bibasilar sounds.  He received CAT x 1 hour in the ED and solumedrol which helped.  Blood cultures drawn and he was started on Levaquin in the ED.  He was also noted to have a mildly displaced right 6th rib fracture likely secondary to violent coughing fits.  He  denied any significant pain with this and Tylenol was given as needed for pain.  He was transitioned to po Levaquin and steroids were discontinued as his respiratory status appeared to be improving.  He was discharged home with po Levaquin to complete a total course of 7 days on antibiotics. At time of discharge, patient was stable and back at baseline.    Issues for Follow Up:  1. Respiratory status.  Patient with residual cough that is expected to improve as his infection resolves.  2. To complete a course of Levaquin (total of 7 days on antibiotics).  Please make sure patient has completed this.  3. Started on Xarelto for PE treatment and prophylaxis.  Please make sure he has picked this up and is taking it correctly.  4. Patient highly anxious throughout hospitalization.  Requesting Valium for situational anxiety ie enclosed spaces.  Discharged on small amount of Valium but instructed to follow up with his PCP for further evaluation.  Consider workup for possible generalized anxiety disorder with GAD-7 outpatient.  May benefit from SSRI.   Significant Procedures: None  Significant Labs and Imaging:   Recent Labs Lab 11/07/16 0935 11/08/16 0347 11/09/16 0418  WBC 11.8* 19.5* 29.0*  HGB 15.4 13.9 13.9  HCT 43.3 40.1 40.9  PLT 275 294 270    Recent Labs Lab 11/07/16 0935 11/07/16 1724 11/08/16 0347 11/09/16 0418  NA 137  --  135 136  K 4.4  --  3.7 4.4  CL 102  --  103 102  CO2 28  --  21* 25  GLUCOSE 117*  --  165*  130*  BUN 13  --  20 28*  CREATININE 1.20  --  1.36* 1.38*  CALCIUM 9.1  --  8.9 9.0  MG  --  2.2  --   --   PHOS  --  2.5  --   --   ALKPHOS 65  --  56 56  AST 22  --  25 40  ALT 18  --  18 23  ALBUMIN 4.1  --  3.9 3.7   Results/Tests Pending at Time of Discharge: None  Discharge Medications:  Allergies as of 11/09/2016      Reactions   Ampicillin Other (See Comments)   Childhood; Unsure of reaction.   Sulfa Antibiotics Other (See Comments)    unspecified      Medication List    STOP taking these medications   clindamycin 300 MG capsule Commonly known as:  CLEOCIN   HYDROMET 5-1.5 MG/5ML syrup Generic drug:  HYDROcodone-homatropine     TAKE these medications   benzonatate 100 MG capsule Commonly known as:  TESSALON Take 1 capsule (100 mg total) by mouth 3 (three) times daily as needed for cough.   chlorpheniramine 4 MG tablet Commonly known as:  CHLOR-TRIMETON Take 8 mg by mouth at bedtime.   diazepam 5 MG tablet Commonly known as:  VALIUM Take 1 tablet (5 mg total) by mouth 2 (two) times daily as needed for anxiety.   fluticasone 50 MCG/ACT nasal spray Commonly known as:  FLONASE Place 1 spray into both nostrils daily.   levofloxacin 750 MG tablet Commonly known as:  LEVAQUIN Take 1 tablet (750 mg total) by mouth daily.   loratadine 10 MG tablet Commonly known as:  CLARITIN Take 10 mg by mouth daily as needed for allergies.   losartan 50 MG tablet Commonly known as:  COZAAR Take 50 mg by mouth daily.   montelukast 10 MG tablet Commonly known as:  SINGULAIR Take 10 mg by mouth at bedtime.   Rivaroxaban 15 MG Tabs tablet Commonly known as:  XARELTO Take 1 tablet (15 mg total) by mouth 2 (two) times daily with a meal.   rivaroxaban 20 MG Tabs tablet Commonly known as:  XARELTO Take 1 tablet (20 mg total) by mouth daily with supper. Start taking on:  11/29/2016      Discharge Instructions: Please refer to Patient Instructions section of EMR for full details.  Patient was counseled important signs and symptoms that should prompt return to medical care, changes in medications, dietary instructions, activity restrictions, and follow up appointments.   Follow-Up Appointments: Follow-up Information    [facility] Follow up.   Why:  Please pick up Xarelto Starter Pack at this location. Contact information:  [address], [place], [place] [address]  [phone]       Juanita Lasteripton, John S, MD.  Schedule an appointment as soon as possible for a visit in 1 week(s).   Specialty:  Family Medicine Contact information: [address] [place] [facility] [place] [address] [phone]          Freddrick MarchAmin, Brittni Hult, MD 11/12/2016, 8:28 PM PGY-1, [facility] Family Medicine

## 2016-11-14 ENCOUNTER — Encounter (HOSPITAL_COMMUNITY): Payer: Self-pay | Admitting: *Deleted

## 2016-11-14 ENCOUNTER — Inpatient Hospital Stay (HOSPITAL_COMMUNITY)
Admission: EM | Admit: 2016-11-14 | Discharge: 2016-11-17 | DRG: 393 | Disposition: A | Discharge status: Home / Self Care | Payer: 59 | Attending: Family Medicine | Admitting: Family Medicine

## 2016-11-14 DIAGNOSIS — K644 Residual hemorrhoidal skin tags: Secondary | ICD-10-CM | POA: Diagnosis present

## 2016-11-14 DIAGNOSIS — Z86711 Personal history of pulmonary embolism: Secondary | ICD-10-CM

## 2016-11-14 DIAGNOSIS — Z882 Allergy status to sulfonamides status: Secondary | ICD-10-CM

## 2016-11-14 DIAGNOSIS — R059 Cough, unspecified: Secondary | ICD-10-CM

## 2016-11-14 DIAGNOSIS — Z880 Allergy status to penicillin: Secondary | ICD-10-CM

## 2016-11-14 DIAGNOSIS — K648 Other hemorrhoids: Principal | ICD-10-CM | POA: Diagnosis present

## 2016-11-14 DIAGNOSIS — Z79899 Other long term (current) drug therapy: Secondary | ICD-10-CM

## 2016-11-14 DIAGNOSIS — I2699 Other pulmonary embolism without acute cor pulmonale: Secondary | ICD-10-CM | POA: Diagnosis present

## 2016-11-14 DIAGNOSIS — K625 Hemorrhage of anus and rectum: Secondary | ICD-10-CM | POA: Diagnosis not present

## 2016-11-14 DIAGNOSIS — Z7901 Long term (current) use of anticoagulants: Secondary | ICD-10-CM

## 2016-11-14 DIAGNOSIS — I10 Essential (primary) hypertension: Secondary | ICD-10-CM | POA: Diagnosis present

## 2016-11-14 DIAGNOSIS — F419 Anxiety disorder, unspecified: Secondary | ICD-10-CM | POA: Diagnosis present

## 2016-11-14 DIAGNOSIS — K921 Melena: Secondary | ICD-10-CM | POA: Diagnosis present

## 2016-11-14 DIAGNOSIS — R05 Cough: Secondary | ICD-10-CM

## 2016-11-14 LAB — CBC
HCT: 44.3 % (ref 39.0–52.0)
Hemoglobin: 15.4 g/dL (ref 13.0–17.0)
MCH: 31 pg (ref 26.0–34.0)
MCHC: 34.8 g/dL (ref 30.0–36.0)
MCV: 89.3 fL (ref 78.0–100.0)
PLATELETS: 240 10*3/uL (ref 150–400)
RBC: 4.96 MIL/uL (ref 4.22–5.81)
RDW: 13.2 % (ref 11.5–15.5)
WBC: 13.2 10*3/uL — AB (ref 4.0–10.5)

## 2016-11-14 LAB — COMPREHENSIVE METABOLIC PANEL
ALT: 26 U/L (ref 17–63)
AST: 20 U/L (ref 15–41)
Albumin: 3.8 g/dL (ref 3.5–5.0)
Alkaline Phosphatase: 65 U/L (ref 38–126)
Anion gap: 7 (ref 5–15)
BUN: 16 mg/dL (ref 6–20)
CALCIUM: 8.9 mg/dL (ref 8.9–10.3)
CHLORIDE: 106 mmol/L (ref 101–111)
CO2: 24 mmol/L (ref 22–32)
CREATININE: 1.26 mg/dL — AB (ref 0.61–1.24)
GFR, EST NON AFRICAN AMERICAN: 60 mL/min — AB (ref >60)
Glucose, Bld: 110 mg/dL — ABNORMAL HIGH (ref 65–99)
Potassium: 4.4 mmol/L (ref 3.5–5.1)
Sodium: 137 mmol/L (ref 135–145)
Total Bilirubin: 1.1 mg/dL (ref 0.3–1.2)
Total Protein: 6.2 g/dL — ABNORMAL LOW (ref 6.5–8.1)

## 2016-11-14 LAB — TYPE AND SCREEN
ABO/RH(D): O POS
Antibody Screen: NEGATIVE

## 2016-11-14 LAB — PROTIME-INR
INR: 0.97
PROTHROMBIN TIME: 12.9 s (ref 11.4–15.2)

## 2016-11-14 LAB — ABO/RH: ABO/RH(D): O POS

## 2016-11-14 NOTE — ED Triage Notes (Signed)
Pt reports being here one week ago for cough, PE and broken rib. Pt was admitted and started on xarelto. Pt began having rectal bleeding on Monday and has dc'd his xarelto. Went to GI dr today and was told to come for admission, colonoscopy and heparin. Still has cough.

## 2016-11-15 ENCOUNTER — Encounter (HOSPITAL_COMMUNITY): Payer: Self-pay | Admitting: *Deleted

## 2016-11-15 DIAGNOSIS — T50905A Adverse effect of unspecified drugs, medicaments and biological substances, initial encounter: Secondary | ICD-10-CM | POA: Diagnosis present

## 2016-11-15 DIAGNOSIS — K921 Melena: Secondary | ICD-10-CM | POA: Diagnosis present

## 2016-11-15 DIAGNOSIS — R059 Cough, unspecified: Secondary | ICD-10-CM

## 2016-11-15 DIAGNOSIS — R05 Cough: Secondary | ICD-10-CM

## 2016-11-15 LAB — POC OCCULT BLOOD, ED: Fecal Occult Bld: POSITIVE — AB

## 2016-11-15 LAB — HEPARIN LEVEL (UNFRACTIONATED)
HEPARIN UNFRACTIONATED: 0.64 [IU]/mL (ref 0.30–0.70)
HEPARIN UNFRACTIONATED: 0.5 [IU]/mL (ref 0.30–0.70)
Heparin Unfractionated: <0.1 IU/mL — ABNORMAL LOW (ref 0.30–0.70)

## 2016-11-15 LAB — CBC
HCT: 46.3 % (ref 39.0–52.0)
Hemoglobin: 16 g/dL (ref 13.0–17.0)
MCH: 30.9 pg (ref 26.0–34.0)
MCHC: 34.6 g/dL (ref 30.0–36.0)
MCV: 89.6 fL (ref 78.0–100.0)
PLATELETS: 243 10*3/uL (ref 150–400)
RBC: 5.17 MIL/uL (ref 4.22–5.81)
RDW: 13.7 % (ref 11.5–15.5)
WBC: 12.7 10*3/uL — ABNORMAL HIGH (ref 4.0–10.5)

## 2016-11-15 LAB — COMPREHENSIVE METABOLIC PANEL
ALT: 26 U/L (ref 17–63)
ANION GAP: 9 (ref 5–15)
AST: 18 U/L (ref 15–41)
Albumin: 4 g/dL (ref 3.5–5.0)
Alkaline Phosphatase: 66 U/L (ref 38–126)
BUN: 15 mg/dL (ref 6–20)
CO2: 24 mmol/L (ref 22–32)
CREATININE: 1.25 mg/dL — AB (ref 0.61–1.24)
Calcium: 8.9 mg/dL (ref 8.9–10.3)
Chloride: 103 mmol/L (ref 101–111)
GLUCOSE: 118 mg/dL — AB (ref 65–99)
POTASSIUM: 4.1 mmol/L (ref 3.5–5.1)
Sodium: 136 mmol/L (ref 135–145)
Total Bilirubin: 1.3 mg/dL — ABNORMAL HIGH (ref 0.3–1.2)
Total Protein: 7 g/dL (ref 6.5–8.1)

## 2016-11-15 LAB — APTT: aPTT: 32 seconds (ref 24–36)

## 2016-11-15 LAB — PROTIME-INR
INR: 1.06
Prothrombin Time: 13.8 seconds (ref 11.4–15.2)

## 2016-11-15 MED ORDER — PEG 3350-KCL-NA BICARB-NACL 420 G PO SOLR
4000.0000 mL | Freq: Once | ORAL | Status: AC
  Administered 2016-11-15: 4000 mL via ORAL
  Filled 2016-11-15: qty 4000

## 2016-11-15 MED ORDER — HEPARIN (PORCINE) IN NACL 100-0.45 UNIT/ML-% IJ SOLN
1400.0000 [IU]/h | INTRAMUSCULAR | Status: AC
  Administered 2016-11-15: 1400 [IU]/h via INTRAVENOUS
  Filled 2016-11-15: qty 250

## 2016-11-15 MED ORDER — ACETAMINOPHEN 325 MG PO TABS
650.0000 mg | ORAL_TABLET | Freq: Four times a day (QID) | ORAL | Status: DC | PRN
  Administered 2016-11-16: 650 mg via ORAL
  Filled 2016-11-15: qty 2

## 2016-11-15 MED ORDER — LORATADINE 10 MG PO TABS
10.0000 mg | ORAL_TABLET | Freq: Every day | ORAL | Status: DC | PRN
  Administered 2016-11-15 – 2016-11-16 (×2): 10 mg via ORAL
  Filled 2016-11-15 (×2): qty 1

## 2016-11-15 MED ORDER — MONTELUKAST SODIUM 10 MG PO TABS
10.0000 mg | ORAL_TABLET | Freq: Every day | ORAL | Status: DC
  Administered 2016-11-15 – 2016-11-16 (×2): 10 mg via ORAL
  Filled 2016-11-15 (×2): qty 1

## 2016-11-15 MED ORDER — PANTOPRAZOLE SODIUM 40 MG IV SOLR
40.0000 mg | Freq: Two times a day (BID) | INTRAVENOUS | Status: DC
  Administered 2016-11-15 – 2016-11-17 (×5): 40 mg via INTRAVENOUS
  Filled 2016-11-15 (×5): qty 40

## 2016-11-15 MED ORDER — BENZONATATE 100 MG PO CAPS
100.0000 mg | ORAL_CAPSULE | Freq: Three times a day (TID) | ORAL | Status: DC | PRN
  Administered 2016-11-15 – 2016-11-16 (×4): 100 mg via ORAL
  Filled 2016-11-15 (×4): qty 1

## 2016-11-15 MED ORDER — LOSARTAN POTASSIUM 50 MG PO TABS
50.0000 mg | ORAL_TABLET | Freq: Every day | ORAL | Status: DC
  Administered 2016-11-15 – 2016-11-17 (×3): 50 mg via ORAL
  Filled 2016-11-15 (×3): qty 1

## 2016-11-15 MED ORDER — ACETAMINOPHEN 650 MG RE SUPP: 650.0000 mg | Freq: Four times a day (QID) | RECTAL | Status: DC | PRN

## 2016-11-15 MED ORDER — HEPARIN BOLUS VIA INFUSION
4000.0000 [IU] | Freq: Once | INTRAVENOUS | Status: AC
  Administered 2016-11-15: 4000 [IU] via INTRAVENOUS
  Filled 2016-11-15: qty 4000

## 2016-11-15 MED ORDER — SODIUM CHLORIDE 0.9% FLUSH
3.0000 mL | Freq: Two times a day (BID) | INTRAVENOUS | Status: DC
  Administered 2016-11-15 – 2016-11-17 (×4): 3 mL via INTRAVENOUS

## 2016-11-15 MED ORDER — SODIUM CHLORIDE 0.9 % IV SOLN
INTRAVENOUS | Status: DC
  Administered 2016-11-15: 21:00:00 via INTRAVENOUS

## 2016-11-15 MED ORDER — GUAIFENESIN ER 600 MG PO TB12
600.0000 mg | ORAL_TABLET | Freq: Two times a day (BID) | ORAL | Status: DC | PRN
  Administered 2016-11-15 – 2016-11-17 (×2): 600 mg via ORAL
  Filled 2016-11-15 (×2): qty 1

## 2016-11-15 MED ORDER — HEPARIN (PORCINE) IN NACL 100-0.45 UNIT/ML-% IJ SOLN
1400.0000 [IU]/h | INTRAMUSCULAR | Status: DC
  Administered 2016-11-15: 1500 [IU]/h via INTRAVENOUS
  Filled 2016-11-15: qty 250

## 2016-11-15 MED ORDER — ALBUTEROL SULFATE (2.5 MG/3ML) 0.083% IN NEBU
2.5000 mg | INHALATION_SOLUTION | RESPIRATORY_TRACT | Status: DC | PRN
  Administered 2016-11-15: 2.5 mg via RESPIRATORY_TRACT
  Filled 2016-11-15: qty 3

## 2016-11-15 MED ORDER — SODIUM CHLORIDE 0.9 % IV SOLN
Freq: Once | INTRAVENOUS | Status: AC
Start: 2016-11-15 — End: 2016-11-15
  Administered 2016-11-15: 04:00:00 via INTRAVENOUS

## 2016-11-15 NOTE — Progress Notes (Signed)
Family Medicine Teaching Service Daily Progress Note Intern Pager: [phone]  Patient name: Tom HanlyRobert [name] Medical record number: [id number] Date of birth: 1956/02/16 Age: 61 y.o. Gender: male  Primary Care Provider: Juanita Lasteripton, John S, MD Consultants: GI Code Status: FULL   Pt Overview and Major Events to Date:  Admit 5/16   Assessment and Plan: Tom HanlyRobert Stemmler is a 61 y.o. male presenting with bright red blood per rectum. PMH is significant for HTN, PE, anxiety.   Hematochezia in setting of recently starting Xarelto for PE on previous admission.  Discharged on 5/11.  Two episodes of bloody bowel movements, diarrhea associated. No symptoms significant for anemia. Hemoglobin 15.4 today.   Patient states since he stopped taking the Xarelto he has not had any significant bloody bowel movements.   Positive FOBT test. PT/INR wnl.  Repeat CBC this AM with stable Hgb of 16 from 15.4.  Repeat PT/INR remain within normal limits.  - Type and Screen ordered  -  NS @ 50 ml per   -  GI consult placed this AM, appreciate recommendations  -  Will keep NPO for now with sips with meds pending GI recs  -  Continue IV Protonix  -  Will start heparin for anticoagulation in setting of recently discovered PE  -  Vitals per floor   PE: recent diagnosis of PE on 5/9 with CTA showing a small filling defects seen in lower lobe branch of right pulmonary artery concerning for small pulmonary embolus. Patient was discharged on Xarelto, 2 and half days ago. Currently subtherapeutic. Discussed risk and benefits with patient of keeping for anticoagulation.  - continue Heparin   HTN- BP normotensive on admission.  139/91 this AM.  SCr 1.26, Baseline 1.1-1.2.  SCr 1.25 this morning.  - Continue Losartan   Cough associated with CAP. Significantly improved since last admission at which time he was treated with Levaquin for CAP.  Has completed course of antibiotics outpatient and done well except for cough.   - Tessalon  perles as needed   FEN/GI: NPO  Prophylaxis: heparin per pharmacy   Disposition: Dispo pending clinical improvement.    Subjective:  Patient sitting in hospital chair, feels well.  Notes that he has a cough from last admission which is improving.  No acute events overnight.    Objective: Temp:  [98.8 F (37.1 C)-99.6 F (37.6 C)] 98.8 F (37.1 C) (05/17 0324) Pulse Rate:  [98-108] 102 (05/17 0324) Resp:  [18-28] 22 (05/17 0324) BP: (124-139)/(68-91) 139/91 (05/17 0324) SpO2:  [92 %-99 %] 93 % (05/17 0324) Weight:  [192 lb 12.8 oz (87.5 kg)] 192 lb 12.8 oz (87.5 kg) (05/17 0324) Physical Exam: General: 61 yo M, comfortable appearing and very talkative,  NAD  Eyes: PERRLA  ENTM: MMM, o/p clear Neck: supple Cardiovascular: RRR, no MRG  Respiratory: CTAB, WOB is comfortable on RA  Gastrointestinal: soft, NTND, +bs MSK: No edema or tenderness noted  Derm: No rashes or ulcers  Neuro: AOx3, no focal deficits  Psych: normal mood and affect    Laboratory:  Recent Labs Lab 11/09/16 0418 11/14/16 1817 11/15/16 0329  WBC 29.0* 13.2* 12.7*  HGB 13.9 15.4 16.0  HCT 40.9 44.3 46.3  PLT 270 240 243    Recent Labs Lab 11/09/16 0418 11/14/16 1817 11/15/16 0329  NA 136 137 136  K 4.4 4.4 4.1  CL 102 106 103  CO2 25 24 24   BUN 28* 16 15  CREATININE 1.38* 1.26* 1.25*  CALCIUM 9.0 8.9  8.9  PROT 6.2* 6.2* 7.0  BILITOT 0.8 1.1 1.3*  ALKPHOS 56 65 66  ALT 23 26 26   AST 40 20 18  GLUCOSE 130* 110* 118*   Imaging/Diagnostic Tests: No results found.   Tom March, MD 11/15/2016, 1:56 PM PGY-1, [facility] Family Medicine FPTS Intern pager: [phone], text pages welcome

## 2016-11-15 NOTE — Progress Notes (Signed)
NURSING PROGRESS NOTE  Tom HanlyRobert [name] [id number] Admission Data: 11/15/2016 7:40 AM Attending Provider: No att. providers found WUJ:[id number]:Tipton, Tom GibneyJohn S, MD Code Status:Full   Tom HanlyRobert [name] is a 61 y.o. male patient admitted from ED:  -No acute distress noted.  -No complaints of shortness of breath.  -No complaints of chest pain.   Cardiac Monitoring: Box # 04 in place. Cardiac monitor yields:sinus tachycardia.  Blood pressure (!) 139/91, pulse (!) 102, temperature 98.8 F (37.1 C), temperature source Oral, resp. rate (!) 22, height 6' (1.829 m), weight 87.5 kg (192 lb 12.8 oz), SpO2 93 %.   IV Fluids:  IV in place, occlusive dsg intact without redness, IV cath antecubital left, condition patent and no redness none.   Allergies:  Ampicillin and Sulfa antibiotics  Past Medical History:   has a past medical history of Anxiety; Hypertension; Multiple allergies; and PE (pulmonary thromboembolism) (HCC) (10/2015).  Past Surgical History:   has a past surgical history that includes Knee cartilage surgery (Right, 1972) and Mouth surgery.  Social History:   reports that he has never smoked. He has never used smokeless tobacco. He reports that he drinks about 0.6 oz of alcohol per week . He reports that he does not use drugs.  Skin: skin intact  Patient/Family orientated to room. Information packet given to patient/family. Admission inpatient armband information verified with patient/family to include name and date of birth and placed on patient arm. Side rails up x 2, fall assessment and education completed with patient/family. Patient/family able to verbalize understanding of risk associated with falls and verbalized understanding to call for assistance before getting out of bed. Call light within reach. Patient/family able to voice and demonstrate understanding of unit orientation instructions.    Will continue to evaluate and treat per MD orders.

## 2016-11-15 NOTE — Progress Notes (Signed)
Golytely prep started and explained to patient.

## 2016-11-15 NOTE — Progress Notes (Addendum)
ANTICOAGULATION CONSULT NOTE - Initial Consult  Pharmacy Consult for Heparin Indication: Recent PE on 5/9 CTA (Xarelto PTA)  Allergies  Allergen Reactions  . Ampicillin Other (See Comments)    Childhood; Unsure of reaction.  . Sulfa Antibiotics Other (See Comments)    unspecified    Patient Measurements:   Heparin Dosing Weight: 91 kg  Vital Signs: Temp: 99.6 F (37.6 C) (05/16 1847) Temp Source: Oral (05/16 1847) BP: 124/75 (05/17 0211) Pulse Rate: 108 (05/17 0211)  Labs:  Recent Labs  11/14/16 1817  HGB 15.4  HCT 44.3  PLT 240  LABPROT 12.9  INR 0.97  CREATININE 1.26*    Estimated Creatinine Clearance: 69.6 mL/min (A) (by C-G formula based on SCr of 1.26 mg/dL (H)).   Medical History: Past Medical History:  Diagnosis Date  . Anxiety   . Hypertension   . Multiple allergies   . PE (pulmonary thromboembolism) (HCC) 10/2015    Medications:  Prescriptions Prior to Admission  Medication Sig Dispense Refill Last Dose  . benzonatate (TESSALON) 100 MG capsule Take 1 capsule (100 mg total) by mouth 3 (three) times daily as needed for cough. 20 capsule 0 11/14/2016 at Unknown time  . chlorpheniramine (CHLOR-TRIMETON) 4 MG tablet Take 8 mg by mouth at bedtime.   11/13/2016 at Unknown time  . diazepam (VALIUM) 5 MG tablet Take 1 tablet (5 mg total) by mouth 2 (two) times daily as needed for anxiety. 10 tablet 0 unknown  . loratadine (CLARITIN) 10 MG tablet Take 10 mg by mouth daily as needed for allergies.   11/14/2016 at Unknown time  . losartan (COZAAR) 50 MG tablet Take 50 mg by mouth daily.   11/14/2016 at Unknown time  . montelukast (SINGULAIR) 10 MG tablet Take 10 mg by mouth at bedtime.   11/13/2016 at Unknown time  . Rivaroxaban (XARELTO) 15 MG TABS tablet Take 1 tablet (15 mg total) by mouth 2 (two) times daily with a meal. 42 tablet 0 11/12/2016 at 2100  . levofloxacin (LEVAQUIN) 750 MG tablet Take 1 tablet (750 mg total) by mouth daily. (Patient not taking:  Reported on 11/15/2016) 5 tablet 0 Completed Course at Unknown time  . [START ON 11/29/2016] rivaroxaban (XARELTO) 20 MG TABS tablet Take 1 tablet (20 mg total) by mouth daily with supper. 30 tablet 0     Assessment: 61 y.o. M presents with BRPBR. Pt on Xarelto PTA for recent PE 5/9 on CTA. Patient was discharged on xarelto, stopped taking 2 and half days ago due to BRBPR. Xarelto 15mg  po BID - last dose 5/14 2100. Plan to continue to hold Xarelto for now and start heparin gtt. Xarelto may still be affecting heparin levels, if it is, will utilize PTT to monitor heparin. Hgb stable at 15.4.  Noted pt with therapeutic heparin levels during previous admission on 1500 units/hr.  Goal of Therapy:  Heparin level 0.3-0.5 units/ml (lower target per MD with recent GIB) Monitor platelets by anticoagulation protocol: Yes   Plan:  Baseline PTT and heparin level Once these labs obtained, will start heparin with 4000 units bolus and gtt at 1500 units/hr F/u 6 hr PTT or heparin level (depending on baseline labs) Daily heparin level and PTT  Tom [name], PharmD, BCPS Clinical pharmacist, pager [phone] 11/15/2016,3:14 AM   Addendum: Baseline heparin level undetectable so Xarelto no longer affecting which makes sense in pt with decent kidneys and off Xarelto for >2 days. Baseline PTT 32 sec.  Plan: F/u 6 hr heparin level  D/c PTT  Tom Fabianaron Kenslee [name], PharmD, BCPS Clinical pharmacist, pager [phone] 11/15/2016 5:22 AM

## 2016-11-15 NOTE — H&P (Signed)
Family Medicine Teaching [facility] Admission History and Physical Service Pager: [phone]  Patient name: Tom [name] Medical record number: [id number] Date of birth: 04-20-1956 Age: 61 y.o. Gender: male  Primary Care Provider: Juanita Laster, MD Consultants: None  Code Status: Full   Chief Complaint: bright red blood per rectum   Assessment and Plan: [name] is a 61 y.o. male presenting with bright red blood per rectum. PMH is significant for HTN, PE,   Hematochezia in setting of recently being started xarelto: Two episodes of bloody bowel movements, diarrhea associated. No symptoms significant for anemia. Hemoglobin 15.4 today. Patient states since he stopped taking the Xarelto he has not had any significant bloody bowel movements. Positive FOBT test. PT/INR wnl.  - Admit to observation telemetry, attending Dr. Lum Babe  - Vitals per floor  - Type and Screen  -  Keep NPO  -  NS @ 50 ml per   -  Repeat CBC in the AM   -  Consult GI in the AM  -  Start IV Protonix  -  Will start heparin for anticoagulation  PE: recent diagnosis of PE on 5/9 with CTA showing a small filling defects seen in lower lobe branch of right pulmonary artery concerning for small pulmonary embolus. Patient was discharged on xarelto, stopped taking 2 and half days ago. Currently subtherapeutic. Discussed risk and benefits with patient of keeping for anticoagulation.  - Will start heparin   HTN- SCr 1.26, Baseline 1.1-1.2 - Continue Losartan   Cough associated with CAP. Significantly improved since last admission   - Tessalon perles as needed for cough   FEN/GI: Regular Diet  Prophylaxis: heparin per pharmacy    Disposition: observation   History of Present Illness:  Tom [name] is a 61 y.o. male presenting with bright red blood per rectum. Patient was recently discharged on 5/11 from our service following admission for CAP plus lower lobe clot in the right pulmonary artery. Patient was  discharged with Xarelto for the clot, he had been taking his medication then he noted some blood in his stool on Monday morning. He states that this was bright red blood per rectum. Took his last dose of Xarelto on Monday night. He then had another bloody bowel movement on Tuesday and did not take any further Xarelto. He went to see his primary care physician who referred him to gastroenterology. Gastroenterology then told him to come to the ED for further workup. Patient has not been taking his Xarelto for the past 2 days. He denies any significant shortness of breath, or cough. He states that his cough and shortness of breath that he had on last admission is resolving and improving. He denies any dizziness, palpitations. He denies any melena in his stool.  Hx of normal colonoscopy per patient.    Review Of Systems: Per HPI with the following additions:   Review of Systems  Constitutional: Negative for chills and fever.  HENT: Negative for congestion.   Respiratory: Negative for cough, shortness of breath and wheezing.   Cardiovascular: Negative for chest pain.  Gastrointestinal: Positive for blood in stool. Negative for abdominal pain, melena, nausea and vomiting.  Skin: Negative for rash.  Neurological: Negative for dizziness.    Patient Active Problem List   Diagnosis Date Noted  . Pulmonary embolism (HCC) 11/07/2016  . Dyspnea   . Hypoxia   . Tachycardia     Past Medical History: Past Medical History:  Diagnosis Date  . Anxiety   .  Hypertension   . Multiple allergies   . PE (pulmonary thromboembolism) (HCC) 10/2015    Past Surgical History: Past Surgical History:  Procedure Laterality Date  . KNEE CARTILAGE SURGERY Right 1972  . MOUTH SURGERY      Social History: Social History  Substance Use Topics  . Smoking status: Never Smoker  . Smokeless tobacco: Never Used  . Alcohol use Yes     Comment: rare   Please also refer to relevant sections of EMR.  Family  History: History reviewed. No pertinent family history.  Allergies and Medications: Allergies  Allergen Reactions  . Ampicillin Other (See Comments)    Childhood; Unsure of reaction.  . Sulfa Antibiotics Other (See Comments)    unspecified   No current facility-administered medications on file prior to encounter.    Current Outpatient Prescriptions on File Prior to Encounter  Medication Sig Dispense Refill  . benzonatate (TESSALON) 100 MG capsule Take 1 capsule (100 mg total) by mouth 3 (three) times daily as needed for cough. 20 capsule 0  . chlorpheniramine (CHLOR-TRIMETON) 4 MG tablet Take 8 mg by mouth at bedtime.    . diazepam (VALIUM) 5 MG tablet Take 1 tablet (5 mg total) by mouth 2 (two) times daily as needed for anxiety. 10 tablet 0  . loratadine (CLARITIN) 10 MG tablet Take 10 mg by mouth daily as needed for allergies.    Marland Kitchen. losartan (COZAAR) 50 MG tablet Take 50 mg by mouth daily.    . montelukast (SINGULAIR) 10 MG tablet Take 10 mg by mouth at bedtime.    . Rivaroxaban (XARELTO) 15 MG TABS tablet Take 1 tablet (15 mg total) by mouth 2 (two) times daily with a meal. 42 tablet 0  . levofloxacin (LEVAQUIN) 750 MG tablet Take 1 tablet (750 mg total) by mouth daily. (Patient not taking: Reported on 11/15/2016) 5 tablet 0  . [START ON 11/29/2016] rivaroxaban (XARELTO) 20 MG TABS tablet Take 1 tablet (20 mg total) by mouth daily with supper. 30 tablet 0    Objective: BP 135/89   Pulse (!) 106   Temp 99.6 F (37.6 C) (Oral)   Resp (!) 28   SpO2 94%  Exam: General: Sitting on the side of the bed, NAD  Eyes: PERRLA ENTM: Moist mucosa membranes  Neck: No lymphadenopathy, no thyromegaly  Cardiovascular: RRR, no murmurs  Respiratory: CTAB, no increased WOB Gastrointestinal:BS+, non-distended, non-tender,   MSK: No lower extremity edema  Derm: No rashes or ulcers  Neuro: Normal strength in upper and lower extremities  Psych: Appropriate cognition  Labs and Imaging: CBC BMET    Recent Labs Lab 11/14/16 1817  WBC 13.2*  HGB 15.4  HCT 44.3  PLT 240    Recent Labs Lab 11/14/16 1817  NA 137  K 4.4  CL 106  CO2 24  BUN 16  CREATININE 1.26*  GLUCOSE 110*  CALCIUM 8.9      Diamonte Stavely, Antionette PolesAsiyah Zahra, MD 11/15/2016, 1:10 AM PGY-2,  Family Medicine FPTS Intern pager: [phone], text pages welcome

## 2016-11-15 NOTE — Progress Notes (Addendum)
ANTICOAGULATION CONSULT NOTE - Initial Consult  Pharmacy Consult for Heparin Indication: Recent PE on 5/9 CTA (Xarelto PTA)  Allergies  Allergen Reactions  . Ampicillin Other (See Comments)    Childhood; Unsure of reaction.  . Sulfa Antibiotics Other (See Comments)    unspecified    Patient Measurements: Height: 6' (182.9 cm) Weight: 192 lb 12.8 oz (87.5 kg) IBW/kg (Calculated) : 77.6 Heparin Dosing Weight: 91 kg  Vital Signs: Temp: 98.2 F (36.8 C) (05/17 1510) Temp Source: Oral (05/17 1510) BP: 115/80 (05/17 1510) Pulse Rate: 100 (05/17 1510)  Assessment: 61 y.o. M presents with BRPBR. Pt on Xarelto PTA for recent PE 5/9 on CTA. Patient was discharged on xarelto, stopped taking 2 and half days ago. Heparin level therapeutic and down to 0.5 after rate decrease. CBC stable, no s/s of bleed.  Goal of Therapy:  Heparin level 0.3-0.5 units/ml (lower target per MD with recent GIB) Monitor platelets by anticoagulation protocol: Yes   Plan:  Continue heparin gtt at 1,400 units/hr Monitor daily heparin level, CBC, s/s of bleed Heparin to stop at midnight for colonoscopy tomorrow  Tom [name], PharmD, BCPS Clinical Pharmacist Pager [phone] 11/15/2016 5:29 PM

## 2016-11-15 NOTE — Progress Notes (Signed)
ANTICOAGULATION CONSULT NOTE - Initial Consult  Pharmacy Consult for Heparin Indication: Recent PE on 5/9 CTA (Xarelto PTA)  Allergies  Allergen Reactions  . Ampicillin Other (See Comments)    Childhood; Unsure of reaction.  . Sulfa Antibiotics Other (See Comments)    unspecified    Patient Measurements: Height: 6' (182.9 cm) Weight: 192 lb 12.8 oz (87.5 kg) IBW/kg (Calculated) : 77.6 Heparin Dosing Weight: 91 kg  Vital Signs: Temp: 98.8 F (37.1 C) (05/17 0324) Temp Source: Oral (05/17 0324) BP: 139/91 (05/17 0324) Pulse Rate: 102 (05/17 0324)  Labs:  Recent Labs  11/14/16 1817 11/15/16 0329 11/15/16 0926  HGB 15.4 16.0  --   HCT 44.3 46.3  --   PLT 240 243  --   APTT  --  32  --   LABPROT 12.9 13.8  --   INR 0.97 1.06  --   HEPARINUNFRC  --  <0.10* 0.64  CREATININE 1.26* 1.25*  --     Estimated Creatinine Clearance: 68.1 mL/min (A) (by C-G formula based on SCr of 1.25 mg/dL (H)).   Medical History: Past Medical History:  Diagnosis Date  . Anxiety   . Hypertension   . Multiple allergies   . PE (pulmonary thromboembolism) (HCC) 10/2015    Medications:  Prescriptions Prior to Admission  Medication Sig Dispense Refill Last Dose  . benzonatate (TESSALON) 100 MG capsule Take 1 capsule (100 mg total) by mouth 3 (three) times daily as needed for cough. 20 capsule 0 11/14/2016 at Unknown time  . chlorpheniramine (CHLOR-TRIMETON) 4 MG tablet Take 8 mg by mouth at bedtime.   11/13/2016 at Unknown time  . diazepam (VALIUM) 5 MG tablet Take 1 tablet (5 mg total) by mouth 2 (two) times daily as needed for anxiety. 10 tablet 0 unknown  . loratadine (CLARITIN) 10 MG tablet Take 10 mg by mouth daily as needed for allergies.   11/14/2016 at Unknown time  . losartan (COZAAR) 50 MG tablet Take 50 mg by mouth daily.   11/14/2016 at Unknown time  . montelukast (SINGULAIR) 10 MG tablet Take 10 mg by mouth at bedtime.   11/13/2016 at Unknown time  . Rivaroxaban (XARELTO) 15 MG  TABS tablet Take 1 tablet (15 mg total) by mouth 2 (two) times daily with a meal. 42 tablet 0 11/12/2016 at 2100  . levofloxacin (LEVAQUIN) 750 MG tablet Take 1 tablet (750 mg total) by mouth daily. (Patient not taking: Reported on 11/15/2016) 5 tablet 0 Completed Course at Unknown time  . [START ON 11/29/2016] rivaroxaban (XARELTO) 20 MG TABS tablet Take 1 tablet (20 mg total) by mouth daily with supper. 30 tablet 0     Assessment: 61 y.o. M presents with BRPBR. Pt on Xarelto PTA for recent PE 5/9 on CTA. Patient was discharged on xarelto, stopped taking 2 and half days ago due to BRBPR. Xarelto 15mg  po BID - last dose 5/14 2100. Plan to continue to hold Xarelto for now and start heparin gtt. Xarelto may still be affecting heparin levels, if it is, will utilize PTT to monitor heparin. Hgb stable at 15.4.  Noted pt with therapeutic heparin levels during previous admission on 1500 units/hr.  Initial HL is supratherapeutic at 0.64 on heparin 1500 units/hr. Nurse reports no issues with infusion or bleeding. Will drop rate to maintain tighter goal therapeutic range.  Goal of Therapy:  Heparin level 0.3-0.5 units/ml (lower target per MD with recent GIB) Monitor platelets by anticoagulation protocol: Yes   Plan:  Decrease heparin to 1400 units/hr 6h HL Daily HL/CBC Monitor s/sx of bleeding  Arlean Hopping. Newman Pies, PharmD, BCPS Clinical Pharmacist [phone] 11/15/2016,10:31 AM

## 2016-11-15 NOTE — ED Provider Notes (Signed)
Pt was recently admitted to the hospital and dx with a PE about one week ago. No known FPMHx of PE. After being DC'D five days ago, three days ago pt notes a new onset of frequent bowel movements that were softer than normal. Around 20:30 May 14  pt notes having a  diarrhea like bm they also presented with bright red stool. After this episode he notes he took his prescribed Xarelto around 21:00 to help subside the bleeding. His next bowel movement was around 23:00, t this time he still notes blood in his stool and he states he stopped his xarelto, the last dose was on May 14. He states the bleeding is still present, but not as heavy. He saw his GI today and was told he needed to be admitted.   He also c/o a cough that is worse at night, especially if he lays flat, despite taking tessalon perles . He also states sometimes he feels lightheaded, dizzy or weak. He denies any abdominal pain.  Pt is awake, alert, sitting on the side of his stretcher in NAD.    Medical screening examination/treatment/procedure(s) were conducted as a shared visit with non-physician practitioner(s) and myself.  I personally evaluated the patient during the encounter.   EKG Interpretation None       Devoria AlbeIva Nature Vogelsang, MD, Concha PyoFACEP           Glenice Ciccone, MD 11/15/16 [phone]

## 2016-11-15 NOTE — Plan of Care (Signed)
Problem: Nutrition: Goal: Adequate nutrition will be maintained Outcome: Progressing Clear liquid pre colonoscopy tomorrow

## 2016-11-15 NOTE — ED Provider Notes (Signed)
[facility]-EMERGENCY DEPT Provider Note   CSN: [id number] Arrival date & time: 11/14/16  1759     History   Chief Complaint Chief Complaint  Patient presents with  . Rectal Bleeding    HPI Tom [name] is a 61 y.o. male.  Patient with past medical history remarkable for PE, hypertension, and anxiety presents emergency department with chief complaint of rectal bleeding. He was recently admitted for pulmonary embolus and started on Xarelto. He states that he noticed blood in his stool 2-3 days ago. He reports several episodes of grossly bloody diarrhea.  He was seen by his gastroenterologist in [facility] earlier and was instructed to come to the emergency department for admission and colonoscopy. Patient states that he preferred to come to [facility].  He denies any dizziness, lightheadedness, but does state that he still has some residual shortness of breath and productive cough from his recent diagnosis of PE. He was also prescribed Levaquin, which he completed yesterday.   The history is provided by the patient. No language interpreter was used.    Past Medical History:  Diagnosis Date  . Anxiety   . Hypertension   . Multiple allergies   . PE (pulmonary thromboembolism) (HCC) 10/2015    Patient Active Problem List   Diagnosis Date Noted  . Pulmonary embolism (HCC) 11/07/2016  . Dyspnea   . Hypoxia   . Tachycardia     Past Surgical History:  Procedure Laterality Date  . KNEE CARTILAGE SURGERY Right 1972  . MOUTH SURGERY         Home Medications    Prior to Admission medications   Medication Sig Start Date End Date Taking? Authorizing Provider  benzonatate (TESSALON) 100 MG capsule Take 1 capsule (100 mg total) by mouth 3 (three) times daily as needed for cough. 11/09/16  Yes Freddrick March, MD  chlorpheniramine (CHLOR-TRIMETON) 4 MG tablet Take 8 mg by mouth at bedtime.   Yes [provider]  diazepam (VALIUM) 5 MG tablet Take 1 tablet (5 mg total) by mouth  2 (two) times daily as needed for anxiety. 11/09/16  Yes Freddrick March, MD  loratadine (CLARITIN) 10 MG tablet Take 10 mg by mouth daily as needed for allergies.   Yes [provider]  losartan (COZAAR) 50 MG tablet Take 50 mg by mouth daily. 11/01/16  Yes [provider]  montelukast (SINGULAIR) 10 MG tablet Take 10 mg by mouth at bedtime. 11/01/16  Yes [provider]  Rivaroxaban (XARELTO) 15 MG TABS tablet Take 1 tablet (15 mg total) by mouth 2 (two) times daily with a meal. 11/09/16  Yes Freddrick March, MD  levofloxacin (LEVAQUIN) 750 MG tablet Take 1 tablet (750 mg total) by mouth daily. Patient not taking: Reported on 11/15/2016 11/09/16   Freddrick March, MD  rivaroxaban (XARELTO) 20 MG TABS tablet Take 1 tablet (20 mg total) by mouth daily with supper. 11/29/16   Freddrick March, MD    Family History History reviewed. No pertinent family history.  Social History Social History  Substance Use Topics  . Smoking status: Never Smoker  . Smokeless tobacco: Never Used  . Alcohol use Yes     Comment: rare     Allergies   Ampicillin and Sulfa antibiotics   Review of Systems Review of Systems  All other systems reviewed and are negative.    Physical Exam Updated Vital Signs BP 135/89   Pulse (!) 106   Temp 99.6 F (37.6 C) (Oral)   Resp Marland Kitchen)  28   SpO2 94%   Physical Exam  Constitutional: He is oriented to person, place, and time. He appears well-developed and well-nourished.  HENT:  Head: Normocephalic and atraumatic.  Eyes: Conjunctivae and EOM are normal. Pupils are equal, round, and reactive to light. Right eye exhibits no discharge. Left eye exhibits no discharge. No scleral icterus.  Neck: Normal range of motion. Neck supple. No JVD present.  Cardiovascular: Normal rate, regular rhythm and normal heart sounds.  Exam reveals no gallop and no friction rub.   No murmur heard. Pulmonary/Chest: Effort normal and breath sounds normal. No respiratory  distress. He has no wheezes. He has no rales. He exhibits no tenderness.  Abdominal: Soft. He exhibits no distension and no mass. There is no tenderness. There is no rebound and no guarding.  Genitourinary:  Genitourinary Comments: Chaperone present for exam, no gross blood on DRE, no visible hemorrhoid or fissure  Musculoskeletal: Normal range of motion. He exhibits no edema or tenderness.  Neurological: He is alert and oriented to person, place, and time.  Skin: Skin is warm and dry.  Psychiatric: He has a normal mood and affect. His behavior is normal. Judgment and thought content normal.  Nursing note and vitals reviewed.    ED Treatments / Results  Labs (all labs ordered are listed, but only abnormal results are displayed) Labs Reviewed  COMPREHENSIVE METABOLIC PANEL - Abnormal; Notable for the following:       Result Value   Glucose, Bld 110 (*)    Creatinine, Ser 1.26 (*)    Total Protein 6.2 (*)    GFR calc non Af Amer 60 (*)    All other components within normal limits  CBC - Abnormal; Notable for the following:    WBC 13.2 (*)    All other components within normal limits  PROTIME-INR  POC OCCULT BLOOD, ED  TYPE AND SCREEN  ABO/RH    EKG  EKG Interpretation None       Radiology No results found.  Procedures Procedures (including critical care time)  Medications Ordered in ED Medications - No data to display   Initial Impression / Assessment and Plan / ED Course  I have reviewed the triage vital signs and the nursing notes.  Pertinent labs & imaging results that were available during my care of the patient were reviewed by me and considered in my medical decision making (see chart for details).     Patient with several reports of grossly bloody stools. Since the ED by his gastroenterologist. Patient is anticoagulated with Xarelto for PE. His vital signs are stable. He does still have some shortness of breath and cough which I presume are related to the  PE versus resolving infectious process.  H/H and vitals are stable now.  However, feel that patient should be monitored given the grossly bloody stools and anticoagulation.   I discussed the case with Dr. Lynelle DoctorKnapp, who agrees with the plan.  Appreciate Family Practice Service for readmitting the patient.  Final Clinical Impressions(s) / ED Diagnoses   Final diagnoses:  Rectal bleeding    New Prescriptions New Prescriptions   No medications on file     Roxy HorsemanBrowning, Kerolos, Cordelia Poche-C 11/15/16 0115    Devoria AlbeKnapp, Iva, MD 11/15/16 [phone]

## 2016-11-15 NOTE — Progress Notes (Signed)
GI MD completed consultation and bowel prep ordered, clear liquid diet ordered, and he will be NPO after midnight. Patient updated on plan of care.

## 2016-11-15 NOTE — Consult Note (Signed)
[facility] Consult  Referring Provider: Dr.Amin Primary Care Physician:  Juanita Lasteripton, John S, MD Primary Gastroenterologist: Dr.Baderddine([place])  Reason for Consultation:  Hematochezia  HPI: Tom [name] is a 61 y.o. Caucasian  male admitted on 11/15/2016 with complaints of rectal bleeding for the past 3 days. Patient was recently diagnosed with a pulmonary embolism and started on Xarelto last Friday. Since use of Xarelto, he has noticed stools are loose and he had an increased frequency of bowel movements. On the second day of starting Xarelto, patient noticed bright red blood in wiping. He continued taking Xarelto until the next day when he noted the amount of rectal bleeding had increased. This was not associated with abdominal pain or symptoms such as lightheadedness, chest pain or shortness of breath. Patient was seen as an outpatient by Dr.Baderddine (GI) who recommended that patient be evaluated as an inpatient as he was not comfortable holding Xarelto  In A patient with A new diagnosis of pulmonary embolism. Patient reports he had a colonoscopy in 2012, he had polyps and was recommended to repeat a colonoscopy in 2017. Recently patient denies unintentional weight loss, loss of appetite, acid reflux, difficulty or pain on swallowing, early satiety or bloating. There is no family history of colon cancer. Patient takes aspirin 325 mg, ON AN as-needed basis for occasional headaches.   Past Medical History:  Diagnosis Date  . Anxiety   . Hypertension   . Multiple allergies   . PE (pulmonary thromboembolism) (HCC) 10/2015    Past Surgical History:  Procedure Laterality Date  . KNEE CARTILAGE SURGERY Right 1972  . MOUTH SURGERY      Prior to Admission medications   Medication Sig Start Date End Date Taking? Authorizing Provider  benzonatate (TESSALON) 100 MG capsule Take 1 capsule (100 mg total) by mouth 3 (three) times daily as needed for cough. 11/09/16  Yes Freddrick MarchAmin,  Yashika, MD  chlorpheniramine (CHLOR-TRIMETON) 4 MG tablet Take 8 mg by mouth at bedtime.   Yes [provider]  diazepam (VALIUM) 5 MG tablet Take 1 tablet (5 mg total) by mouth 2 (two) times daily as needed for anxiety. 11/09/16  Yes Freddrick MarchAmin, Yashika, MD  loratadine (CLARITIN) 10 MG tablet Take 10 mg by mouth daily as needed for allergies.   Yes [provider]  losartan (COZAAR) 50 MG tablet Take 50 mg by mouth daily. 11/01/16  Yes [provider]  montelukast (SINGULAIR) 10 MG tablet Take 10 mg by mouth at bedtime. 11/01/16  Yes [provider]  Rivaroxaban (XARELTO) 15 MG TABS tablet Take 1 tablet (15 mg total) by mouth 2 (two) times daily with a meal. 11/09/16  Yes Freddrick MarchAmin, Yashika, MD  levofloxacin (LEVAQUIN) 750 MG tablet Take 1 tablet (750 mg total) by mouth daily. Patient not taking: Reported on 11/15/2016 11/09/16   Freddrick MarchAmin, Yashika, MD  rivaroxaban (XARELTO) 20 MG TABS tablet Take 1 tablet (20 mg total) by mouth daily with supper. 11/29/16   Freddrick MarchAmin, Yashika, MD    Current Facility-Administered Medications  Medication Dose Route Frequency Provider Last Rate Last Dose  . acetaminophen (TYLENOL) tablet 650 mg  650 mg Oral Q6H PRN Mikell, Antionette PolesAsiyah Zahra, MD       Or  . acetaminophen (TYLENOL) suppository 650 mg  650 mg Rectal Q6H PRN Mikell, Antionette PolesAsiyah Zahra, MD      . benzonatate (TESSALON) capsule 100 mg  100 mg Oral TID PRN Berton BonMikell, Asiyah Zahra, MD   100 mg at 11/15/16 0436  . guaiFENesin (MUCINEX)  12 hr tablet 600 mg  600 mg Oral BID PRN Berton Bon, MD   600 mg at 11/15/16 0802  . heparin ADULT infusion 100 units/mL (25000 units/274mL sodium chloride 0.45%)  1,400 Units/hr Intravenous Continuous Kerin Salen, MD      . loratadine (CLARITIN) tablet 10 mg  10 mg Oral Daily PRN Berton Bon, MD   10 mg at 11/15/16 0802  . losartan (COZAAR) tablet 50 mg  50 mg Oral Daily Berton Bon, MD   50 mg at 11/15/16 0958  . montelukast (SINGULAIR) tablet 10 mg   10 mg Oral QHS Mikell, Asiyah Gust Brooms, MD      . pantoprazole (PROTONIX) injection 40 mg  40 mg Intravenous Q12H Mikell, Antionette Poles, MD   40 mg at 11/15/16 0959  . sodium chloride flush (NS) 0.9 % injection 3 mL  3 mL Intravenous Q12H Berton Bon, MD   3 mL at 11/15/16 0416    Allergies as of 11/14/2016 - Review Complete 11/14/2016  Allergen Reaction Noted  . Ampicillin Other (See Comments) 11/07/2016  . Sulfa antibiotics Other (See Comments) 12/22/2013    History reviewed. No pertinent family history.  Social History   Social History  . Marital status: Divorced    Spouse name: N/A  . Number of children: N/A  . Years of education: N/A   Occupational History  . Not on file.   Social History Main Topics  . Smoking status: Never Smoker  . Smokeless tobacco: Never Used  . Alcohol use 0.6 oz/week    1 Cans of beer per week     Comment: weekly  . Drug use: No  . Sexual activity: No   Other Topics Concern  . Not on file   Social History Narrative  . No narrative on file    Review of Systems: Positive WUJ:[id number] bleeding GI: Described in detail in HPI.    Gen: Denies any fever, chills, rigors, night sweats, anorexia, fatigue, weakness, malaise, involuntary weight loss, and sleep disorder CV: Denies chest pain, angina, palpitations, syncope, orthopnea, PND, peripheral edema, and claudication. Resp: Positive for cough and shortness of breath, Denies wheezing, coughing up blood. GU : Denies urinary burning, blood in urine, urinary frequency, urinary hesitancy, nocturnal urination, and urinary incontinence. MS: Denies joint pain or swelling.  Denies muscle weakness, cramps, atrophy.  Derm: Denies rash, itching, oral ulcerations, hives, unhealing ulcers.  Psych: Denies depression, anxiety, memory loss, suicidal ideation, hallucinations,  and confusion. Heme: Denies bruising, bleeding, and enlarged lymph nodes. Neuro:  Denies any headaches, dizziness, paresthesias. Endo:   Denies any problems with DM, thyroid, adrenal function.  Physical Exam: Vital signs in last 24 hours: Temp:  [98.8 F (37.1 C)-99.6 F (37.6 C)] 98.8 F (37.1 C) (05/17 0324) Pulse Rate:  [98-108] 102 (05/17 0324) Resp:  [18-28] 22 (05/17 0324) BP: (124-139)/(68-91) 139/91 (05/17 0324) SpO2:  [92 %-99 %] 93 % (05/17 0324) Weight:  [87.5 kg (192 lb 12.8 oz)] 87.5 kg (192 lb 12.8 oz) (05/17 0324) Last BM Date: 11/14/16  General:   Alert,  Well-developed, well-nourished, pleasant and cooperative in NAD Head:  Normocephalic and atraumatic. Eyes:  Sclera clear, no icterus.   Conjunctiva pink. Ears:  Normal auditory acuity. Nose:  No deformity, discharge,  or lesions. Mouth:  No deformity or lesions.  Oropharynx pink & moist. Neck:  Supple; no masses or thyromegaly. Lungs:  Clear throughout to auscultation.   No wheezes, crackles, or rhonchi. No acute distress. Heart:  Regular rate and rhythm; no murmurs, clicks, rubs,  or gallops. Extremities:  Without clubbing or edema. Neurologic:  Alert and  oriented x4;  grossly normal neurologically. Skin:  Intact without significant lesions or rashes. Psych:  Alert and cooperative. Normal mood and affect. Abdomen:  Soft, nontender and nondistended. No masses, hepatosplenomegaly or hernias noted. Normal bowel sounds, without guarding, and without rebound.         Lab Results:  Recent Labs  11/14/16 1817 11/15/16 0329  WBC 13.2* 12.7*  HGB 15.4 16.0  HCT 44.3 46.3  PLT 240 243   BMET  Recent Labs  11/14/16 1817 11/15/16 0329  NA 137 136  K 4.4 4.1  CL 106 103  CO2 24 24  GLUCOSE 110* 118*  BUN 16 15  CREATININE 1.26* 1.25*  CALCIUM 8.9 8.9   LFT  Recent Labs  11/15/16 0329  PROT 7.0  ALBUMIN 4.0  AST 18  ALT 26  ALKPHOS 66  BILITOT 1.3*   PT/INR  Recent Labs  11/14/16 1817 11/15/16 0329  LABPROT 12.9 13.8  INR 0.97 1.06    Studies/Results: No results found.  Impression: Painless rectal bleeding,  hemodynamically stable, normal hemoglobin 16, normal platelet  Plan: As patient needs to be on anticoagulation at least for the next 6 months due to newly diagnosed pulmonary embolism, it would be wise to proceed with a diagnostic colonoscopy. As hemoglobin is within normal limits and rectal bleeding is reported as bright red,hemorrhoidal bleeding is certainly a possibility. Other etiologies include av malformations and less likely malignancy. It would be unusual for diverticular bleed to present in such a fashion. The risk and benefits of the procedure were explained to the patient in details, he understands and consents for the procedure. Colonoscopy to be performed in a.m., clear liquid diet and prep orders as written.   LOS: 0 days   Area Anthonia Monger,MD  11/15/2016, 10:57 AM  Pager [phone] If no answer or after 5 PM call [phone]

## 2016-11-16 ENCOUNTER — Observation Stay (HOSPITAL_COMMUNITY): Payer: 59 | Admitting: Anesthesiology

## 2016-11-16 ENCOUNTER — Encounter (HOSPITAL_COMMUNITY): Payer: Self-pay | Admitting: Emergency Medicine

## 2016-11-16 ENCOUNTER — Encounter (HOSPITAL_COMMUNITY)
Admission: EM | Disposition: A | Discharge status: Home / Self Care | Payer: Self-pay | Source: Home / Self Care | Attending: Family Medicine

## 2016-11-16 DIAGNOSIS — K625 Hemorrhage of anus and rectum: Secondary | ICD-10-CM | POA: Diagnosis present

## 2016-11-16 DIAGNOSIS — Z7901 Long term (current) use of anticoagulants: Secondary | ICD-10-CM | POA: Diagnosis not present

## 2016-11-16 DIAGNOSIS — Z86711 Personal history of pulmonary embolism: Secondary | ICD-10-CM | POA: Diagnosis not present

## 2016-11-16 DIAGNOSIS — K644 Residual hemorrhoidal skin tags: Secondary | ICD-10-CM | POA: Diagnosis present

## 2016-11-16 DIAGNOSIS — R05 Cough: Secondary | ICD-10-CM | POA: Diagnosis not present

## 2016-11-16 DIAGNOSIS — K921 Melena: Secondary | ICD-10-CM

## 2016-11-16 DIAGNOSIS — Z882 Allergy status to sulfonamides status: Secondary | ICD-10-CM | POA: Diagnosis not present

## 2016-11-16 DIAGNOSIS — I2699 Other pulmonary embolism without acute cor pulmonale: Secondary | ICD-10-CM | POA: Diagnosis present

## 2016-11-16 DIAGNOSIS — K648 Other hemorrhoids: Secondary | ICD-10-CM | POA: Diagnosis present

## 2016-11-16 DIAGNOSIS — F419 Anxiety disorder, unspecified: Secondary | ICD-10-CM | POA: Diagnosis present

## 2016-11-16 DIAGNOSIS — Z880 Allergy status to penicillin: Secondary | ICD-10-CM | POA: Diagnosis not present

## 2016-11-16 DIAGNOSIS — I10 Essential (primary) hypertension: Secondary | ICD-10-CM | POA: Diagnosis present

## 2016-11-16 DIAGNOSIS — Z79899 Other long term (current) drug therapy: Secondary | ICD-10-CM | POA: Diagnosis not present

## 2016-11-16 HISTORY — PX: COLONOSCOPY WITH PROPOFOL: SHX5780

## 2016-11-16 LAB — CBC
HCT: 41.5 % (ref 39.0–52.0)
HEMOGLOBIN: 14.3 g/dL (ref 13.0–17.0)
MCH: 31.1 pg (ref 26.0–34.0)
MCHC: 34.5 g/dL (ref 30.0–36.0)
MCV: 90.2 fL (ref 78.0–100.0)
PLATELETS: 226 10*3/uL (ref 150–400)
RBC: 4.6 MIL/uL (ref 4.22–5.81)
RDW: 13.5 % (ref 11.5–15.5)
WBC: 8.8 10*3/uL (ref 4.0–10.5)

## 2016-11-16 LAB — BASIC METABOLIC PANEL
ANION GAP: 7 (ref 5–15)
BUN: 15 mg/dL (ref 6–20)
CHLORIDE: 104 mmol/L (ref 101–111)
CO2: 24 mmol/L (ref 22–32)
Calcium: 8.3 mg/dL — ABNORMAL LOW (ref 8.9–10.3)
Creatinine, Ser: 1.25 mg/dL — ABNORMAL HIGH (ref 0.61–1.24)
Glucose, Bld: 106 mg/dL — ABNORMAL HIGH (ref 65–99)
POTASSIUM: 3.7 mmol/L (ref 3.5–5.1)
SODIUM: 135 mmol/L (ref 135–145)

## 2016-11-16 SURGERY — COLONOSCOPY WITH PROPOFOL
Anesthesia: Monitor Anesthesia Care | Laterality: Left

## 2016-11-16 MED ORDER — SODIUM CHLORIDE 0.9 % IV SOLN
INTRAVENOUS | Status: DC | PRN
  Administered 2016-11-16: 11:00:00 via INTRAVENOUS

## 2016-11-16 MED ORDER — ACETAMINOPHEN 325 MG PO TABS: 325.0000 mg | ORAL_TABLET | ORAL | Status: DC | PRN

## 2016-11-16 MED ORDER — FAMOTIDINE 20 MG PO TABS: 20.0000 mg | ORAL_TABLET | Freq: Two times a day (BID) | ORAL | Status: DC

## 2016-11-16 MED ORDER — RIVAROXABAN 15 MG PO TABS: 15.0000 mg | ORAL_TABLET | Freq: Two times a day (BID) | ORAL | Status: DC

## 2016-11-16 MED ORDER — PROPOFOL 10 MG/ML IV BOLUS
INTRAVENOUS | Status: DC | PRN
  Administered 2016-11-16 (×3): 50 mg via INTRAVENOUS

## 2016-11-16 MED ORDER — RIVAROXABAN 15 MG PO TABS
15.0000 mg | ORAL_TABLET | Freq: Two times a day (BID) | ORAL | Status: AC
  Administered 2016-11-16 (×2): 15 mg via ORAL
  Filled 2016-11-16 (×2): qty 1

## 2016-11-16 MED ORDER — ACETAMINOPHEN 160 MG/5ML PO SOLN: 325.0000 mg | ORAL | Status: DC | PRN

## 2016-11-16 MED ORDER — RIVAROXABAN 20 MG PO TABS: 20.0000 mg | ORAL_TABLET | Freq: Every day | ORAL | Status: DC

## 2016-11-16 MED ORDER — RIVAROXABAN 15 MG PO TABS
15.0000 mg | ORAL_TABLET | Freq: Two times a day (BID) | ORAL | Status: DC
  Administered 2016-11-17: 15 mg via ORAL
  Filled 2016-11-16: qty 1

## 2016-11-16 MED ORDER — MEPERIDINE HCL 25 MG/ML IJ SOLN: 6.2500 mg | INTRAMUSCULAR | Status: DC | PRN

## 2016-11-16 MED ORDER — FAMOTIDINE 20 MG PO TABS
20.0000 mg | ORAL_TABLET | Freq: Two times a day (BID) | ORAL | Status: DC
  Administered 2016-11-16 – 2016-11-17 (×2): 20 mg via ORAL
  Filled 2016-11-16 (×3): qty 1

## 2016-11-16 MED ORDER — ONDANSETRON HCL 4 MG/2ML IJ SOLN
4.0000 mg | Freq: Once | INTRAMUSCULAR | Status: DC | PRN
Start: 2016-11-16 — End: 2016-11-16

## 2016-11-16 NOTE — Progress Notes (Signed)
Per endo, they are ready for the patient to go early. They will be here shortly. CCMD notified of temporary removal from telemetry.

## 2016-11-16 NOTE — Anesthesia Postprocedure Evaluation (Addendum)
Anesthesia Post Note  Patient: Tom [name]  Procedure(s) Performed: Procedure(s) (LRB): COLONOSCOPY WITH PROPOFOL (Left)  Patient location during evaluation: Endoscopy Anesthesia Type: MAC Level of consciousness: awake Pain management: pain level controlled Vital Signs Assessment: post-procedure vital signs reviewed and stable Respiratory status: spontaneous breathing Cardiovascular status: stable Postop Assessment: no signs of nausea or vomiting Anesthetic complications: no        Last Vitals:  Vitals:   11/16/16 1014 11/16/16 1123  BP: 138/77 (!) 119/53  Pulse: 98 96  Resp: 15 17  Temp: 37.1 C 36.9 C    Last Pain:  Vitals:   11/16/16 1123  TempSrc: Oral  PainSc:    Pain Goal: Patients Stated Pain Goal: 0 (11/15/16 0750)               Raegyn Renda JR,JOHN Mateo Flow

## 2016-11-16 NOTE — Brief Op Note (Signed)
11/14/2016 - 11/16/2016  11:21 AM  PATIENT:  Essie Christine  61 y.o. male  PRE-OPERATIVE DIAGNOSIS:  hematochezia, newly diagnosed PE, needs anticoagulation  POST-OPERATIVE DIAGNOSIS:  hemorrhoids  PROCEDURE:  Procedure(s): COLONOSCOPY WITH PROPOFOL (Left)  SURGEON:  Surgeon(s) and Role:    Ronnette Juniper, MD - Primary  PHYSICIAN ASSISTANT:   ASSISTANTS: none   ANESTHESIA:   MAC  EBL:  Total I/O In: 440.7 [I.V.:440.7] Out: 2 [Blood:2]  BLOOD ADMINISTERED:none  DRAINS: none   LOCAL MEDICATIONS USED:  NONE  SPECIMEN:  No Specimen  DISPOSITION OF SPECIMEN:  N/A  COUNTS:  YES  TOURNIQUET:  * No tourniquets in log *  DICTATION: .Dragon Dictation  PLAN OF CARE: Admit to inpatient   PATIENT DISPOSITION:  PACU - hemodynamically stable.   Delay start of Pharmacological VTE agent (>24hrs) due to surgical blood loss or risk of bleeding: no

## 2016-11-16 NOTE — Transfer of Care (Signed)
Immediate Anesthesia Transfer of Care Note  Patient: Tom [name]  Procedure(s) Performed: Procedure(s): COLONOSCOPY WITH PROPOFOL (Left)  Patient Location: Endoscopy Unit  Anesthesia Type:MAC  Level of Consciousness: awake, alert  and oriented  Airway & Oxygen Therapy: Patient Spontanous Breathing and Patient connected to face mask oxygen  Post-op Assessment: Report given to RN, Post -op Vital signs reviewed and stable and Patient moving all extremities X 4  Post vital signs: Reviewed and stable  Last Vitals:  Vitals:   11/16/16 0528 11/16/16 1014  BP: 118/64 138/77  Pulse: 84 98  Resp: 20 15  Temp: 36.6 C 37.1 C    Last Pain:  Vitals:   11/16/16 1014  TempSrc: Oral  PainSc:       Patients Stated Pain Goal: 0 (82/70/78 6754)  Complications: No apparent anesthesia complications

## 2016-11-16 NOTE — Discharge Instructions (Signed)
Information on my medicine - XARELTO® (rivaroxaban) ° °This medication education was reviewed with me or my healthcare representative as part of my discharge preparation.   ° °WHY WAS XARELTO® PRESCRIBED FOR YOU? °Xarelto® was prescribed to treat blood clots that may have been found in the veins of your legs (deep vein thrombosis) or in your lungs (pulmonary embolism) and to reduce the risk of them occurring again. ° °What do you need to know about Xarelto®? °The starting dose is one 15 mg tablet taken TWICE daily with food for the FIRST 21 DAYS then the dose is changed to one 20 mg tablet taken ONCE A DAY with your evening meal. ° °DO NOT stop taking Xarelto® without talking to the health care provider who prescribed the medication.  Refill your prescription for 20 mg tablets before you run out. ° °After discharge, you should have regular check-up appointments with your healthcare provider that is prescribing your Xarelto®.  In the future your dose may need to be changed if your kidney function changes by a significant amount. ° °What do you do if you miss a dose? °If you are taking Xarelto® TWICE DAILY and you miss a dose, take it as soon as you remember. You may take two 15 mg tablets (total 30 mg) at the same time then resume your regularly scheduled 15 mg twice daily the next day. ° °If you are taking Xarelto® ONCE DAILY and you miss a dose, take it as soon as you remember on the same day then continue your regularly scheduled once daily regimen the next day. Do not take two doses of Xarelto® at the same time.  ° °Important Safety Information °Xarelto® is a blood thinner medicine that can cause bleeding. You should call your healthcare provider right away if you experience any of the following: °? Bleeding from an injury or your nose that does not stop. °? Unusual colored urine (red or dark brown) or unusual colored stools (red or black). °? Unusual bruising for unknown reasons. °? A serious fall or if you hit  your head (even if there is no bleeding). ° °Some medicines may interact with Xarelto® and might increase your risk of bleeding while on Xarelto®. To help avoid this, consult your healthcare provider or pharmacist prior to using any new prescription or non-prescription medications, including herbals, vitamins, non-steroidal anti-inflammatory drugs (NSAIDs) and supplements. ° °This website has more information on Xarelto®: www.xarelto.com. °

## 2016-11-16 NOTE — H&P (View-Only) (Signed)
[facility] Consult  Referring Provider: Dr.Amin Primary Care Physician:  Juanita Lasteripton, John S, MD Primary Gastroenterologist: Dr.Baderddine([place])  Reason for Consultation:  Hematochezia  HPI: Tom [name] is a 61 y.o. Caucasian  male admitted on 11/15/2016 with complaints of rectal bleeding for the past 3 days. Patient was recently diagnosed with a pulmonary embolism and started on Xarelto last Friday. Since use of Xarelto, he has noticed stools are loose and he had an increased frequency of bowel movements. On the second day of starting Xarelto, patient noticed bright red blood in wiping. He continued taking Xarelto until the next day when he noted the amount of rectal bleeding had increased. This was not associated with abdominal pain or symptoms such as lightheadedness, chest pain or shortness of breath. Patient was seen as an outpatient by Dr.Baderddine (GI) who recommended that patient be evaluated as an inpatient as he was not comfortable holding Xarelto  In A patient with A new diagnosis of pulmonary embolism. Patient reports he had a colonoscopy in 2012, he had polyps and was recommended to repeat a colonoscopy in 2017. Recently patient denies unintentional weight loss, loss of appetite, acid reflux, difficulty or pain on swallowing, early satiety or bloating. There is no family history of colon cancer. Patient takes aspirin 325 mg, ON AN as-needed basis for occasional headaches.   Past Medical History:  Diagnosis Date  . Anxiety   . Hypertension   . Multiple allergies   . PE (pulmonary thromboembolism) (HCC) 10/2015    Past Surgical History:  Procedure Laterality Date  . KNEE CARTILAGE SURGERY Right 1972  . MOUTH SURGERY      Prior to Admission medications   Medication Sig Start Date End Date Taking? Authorizing Provider  benzonatate (TESSALON) 100 MG capsule Take 1 capsule (100 mg total) by mouth 3 (three) times daily as needed for cough. 11/09/16  Yes Freddrick MarchAmin,  Yashika, MD  chlorpheniramine (CHLOR-TRIMETON) 4 MG tablet Take 8 mg by mouth at bedtime.   Yes [provider]  diazepam (VALIUM) 5 MG tablet Take 1 tablet (5 mg total) by mouth 2 (two) times daily as needed for anxiety. 11/09/16  Yes Freddrick MarchAmin, Yashika, MD  loratadine (CLARITIN) 10 MG tablet Take 10 mg by mouth daily as needed for allergies.   Yes [provider]  losartan (COZAAR) 50 MG tablet Take 50 mg by mouth daily. 11/01/16  Yes [provider]  montelukast (SINGULAIR) 10 MG tablet Take 10 mg by mouth at bedtime. 11/01/16  Yes [provider]  Rivaroxaban (XARELTO) 15 MG TABS tablet Take 1 tablet (15 mg total) by mouth 2 (two) times daily with a meal. 11/09/16  Yes Freddrick MarchAmin, Yashika, MD  levofloxacin (LEVAQUIN) 750 MG tablet Take 1 tablet (750 mg total) by mouth daily. Patient not taking: Reported on 11/15/2016 11/09/16   Freddrick MarchAmin, Yashika, MD  rivaroxaban (XARELTO) 20 MG TABS tablet Take 1 tablet (20 mg total) by mouth daily with supper. 11/29/16   Freddrick MarchAmin, Yashika, MD    Current Facility-Administered Medications  Medication Dose Route Frequency Provider Last Rate Last Dose  . acetaminophen (TYLENOL) tablet 650 mg  650 mg Oral Q6H PRN Mikell, Antionette PolesAsiyah Zahra, MD       Or  . acetaminophen (TYLENOL) suppository 650 mg  650 mg Rectal Q6H PRN Mikell, Antionette PolesAsiyah Zahra, MD      . benzonatate (TESSALON) capsule 100 mg  100 mg Oral TID PRN Berton BonMikell, Asiyah Zahra, MD   100 mg at 11/15/16 0436  . guaiFENesin (MUCINEX)  12 hr tablet 600 mg  600 mg Oral BID PRN Berton Bon, MD   600 mg at 11/15/16 0802  . heparin ADULT infusion 100 units/mL (25000 units/274mL sodium chloride 0.45%)  1,400 Units/hr Intravenous Continuous Kerin Salen, MD      . loratadine (CLARITIN) tablet 10 mg  10 mg Oral Daily PRN Berton Bon, MD   10 mg at 11/15/16 0802  . losartan (COZAAR) tablet 50 mg  50 mg Oral Daily Berton Bon, MD   50 mg at 11/15/16 0958  . montelukast (SINGULAIR) tablet 10 mg   10 mg Oral QHS Mikell, Asiyah Gust Brooms, MD      . pantoprazole (PROTONIX) injection 40 mg  40 mg Intravenous Q12H Mikell, Antionette Poles, MD   40 mg at 11/15/16 0959  . sodium chloride flush (NS) 0.9 % injection 3 mL  3 mL Intravenous Q12H Berton Bon, MD   3 mL at 11/15/16 0416    Allergies as of 11/14/2016 - Review Complete 11/14/2016  Allergen Reaction Noted  . Ampicillin Other (See Comments) 11/07/2016  . Sulfa antibiotics Other (See Comments) 12/22/2013    History reviewed. No pertinent family history.  Social History   Social History  . Marital status: Divorced    Spouse name: N/A  . Number of children: N/A  . Years of education: N/A   Occupational History  . Not on file.   Social History Main Topics  . Smoking status: Never Smoker  . Smokeless tobacco: Never Used  . Alcohol use 0.6 oz/week    1 Cans of beer per week     Comment: weekly  . Drug use: No  . Sexual activity: No   Other Topics Concern  . Not on file   Social History Narrative  . No narrative on file    Review of Systems: Positive WUJ:WJXBJY bleeding GI: Described in detail in HPI.    Gen: Denies any fever, chills, rigors, night sweats, anorexia, fatigue, weakness, malaise, involuntary weight loss, and sleep disorder CV: Denies chest pain, angina, palpitations, syncope, orthopnea, PND, peripheral edema, and claudication. Resp: Positive for cough and shortness of breath, Denies wheezing, coughing up blood. GU : Denies urinary burning, blood in urine, urinary frequency, urinary hesitancy, nocturnal urination, and urinary incontinence. MS: Denies joint pain or swelling.  Denies muscle weakness, cramps, atrophy.  Derm: Denies rash, itching, oral ulcerations, hives, unhealing ulcers.  Psych: Denies depression, anxiety, memory loss, suicidal ideation, hallucinations,  and confusion. Heme: Denies bruising, bleeding, and enlarged lymph nodes. Neuro:  Denies any headaches, dizziness, paresthesias. Endo:   Denies any problems with DM, thyroid, adrenal function.  Physical Exam: Vital signs in last 24 hours: Temp:  [98.8 F (37.1 C)-99.6 F (37.6 C)] 98.8 F (37.1 C) (05/17 0324) Pulse Rate:  [98-108] 102 (05/17 0324) Resp:  [18-28] 22 (05/17 0324) BP: (124-139)/(68-91) 139/91 (05/17 0324) SpO2:  [92 %-99 %] 93 % (05/17 0324) Weight:  [87.5 kg (192 lb 12.8 oz)] 87.5 kg (192 lb 12.8 oz) (05/17 0324) Last BM Date: 11/14/16  General:   Alert,  Well-developed, well-nourished, pleasant and cooperative in NAD Head:  Normocephalic and atraumatic. Eyes:  Sclera clear, no icterus.   Conjunctiva pink. Ears:  Normal auditory acuity. Nose:  No deformity, discharge,  or lesions. Mouth:  No deformity or lesions.  Oropharynx pink & moist. Neck:  Supple; no masses or thyromegaly. Lungs:  Clear throughout to auscultation.   No wheezes, crackles, or rhonchi. No acute distress. Heart:  Regular rate and rhythm; no murmurs, clicks, rubs,  or gallops. Extremities:  Without clubbing or edema. Neurologic:  Alert and  oriented x4;  grossly normal neurologically. Skin:  Intact without significant lesions or rashes. Psych:  Alert and cooperative. Normal mood and affect. Abdomen:  Soft, nontender and nondistended. No masses, hepatosplenomegaly or hernias noted. Normal bowel sounds, without guarding, and without rebound.         Lab Results:  Recent Labs  11/14/16 1817 11/15/16 0329  WBC 13.2* 12.7*  HGB 15.4 16.0  HCT 44.3 46.3  PLT 240 243   BMET  Recent Labs  11/14/16 1817 11/15/16 0329  NA 137 136  K 4.4 4.1  CL 106 103  CO2 24 24  GLUCOSE 110* 118*  BUN 16 15  CREATININE 1.26* 1.25*  CALCIUM 8.9 8.9   LFT  Recent Labs  11/15/16 0329  PROT 7.0  ALBUMIN 4.0  AST 18  ALT 26  ALKPHOS 66  BILITOT 1.3*   PT/INR  Recent Labs  11/14/16 1817 11/15/16 0329  LABPROT 12.9 13.8  INR 0.97 1.06    Studies/Results: No results found.  Impression: Painless rectal bleeding,  hemodynamically stable, normal hemoglobin 16, normal platelet  Plan: As patient needs to be on anticoagulation at least for the next 6 months due to newly diagnosed pulmonary embolism, it would be wise to proceed with a diagnostic colonoscopy. As hemoglobin is within normal limits and rectal bleeding is reported as bright red,hemorrhoidal bleeding is certainly a possibility. Other etiologies include av malformations and less likely malignancy. It would be unusual for diverticular bleed to present in such a fashion. The risk and benefits of the procedure were explained to the patient in details, he understands and consents for the procedure. Colonoscopy to be performed in a.m., clear liquid diet and prep orders as written.   LOS: 0 days   Area Lakyn Mantione,MD  11/15/2016, 10:57 AM  Pager [phone] If no answer or after 5 PM call [phone]

## 2016-11-16 NOTE — Progress Notes (Addendum)
ANTICOAGULATION CONSULT NOTE - Initial Consult  Pharmacy Consult for Xarelto  Indication: Recent PE on 5/9 CTA (Xarelto PTA)  Allergies  Allergen Reactions  . Ampicillin Other (See Comments)    Childhood; Unsure of reaction.  . Sulfa Antibiotics Other (See Comments)    unspecified    Patient Measurements: Height: 6' (182.9 cm) Weight: 195 lb (88.5 kg) IBW/kg (Calculated) : 77.6 Heparin Dosing Weight: 91 kg  Vital Signs: Temp: 98.4 F (36.9 C) (05/18 1123) Temp Source: Oral (05/18 1123) BP: 108/48 (05/18 1140) Pulse Rate: 88 (05/18 1140)  Assessment: 61 y.o. M presents with BRPBR. Pt on Xarelto PTA for recent PE 5/9 on CTA. Patient was discharged on Xarelto, stopped taking x2 days before he was re-admitted.   Patient transitioned to heparin gtt while awaiting procedure. Now s/p colonoscopy with findings of hemorrhoids, which GI notes is the likely source of bleeding. Per GI and FM, okay to resume Xarelto. Due to interruption in previous regimen, will start regimen over from the beginning. CBC stable, no overt s/s bleeding noted.   Plan:  Resume Xarelto 15mg  BID x21 days, then 20 mg daily  Monitor CBC and for s/s bleeding  York CeriseKatherine Cook, PharmD Pharmacy Resident  Pager [phone] 11/16/16 11:54 AM

## 2016-11-16 NOTE — Progress Notes (Signed)
Transport here for pt transfer to endo, pt informs staff it will be a few minutes as he needs to brush his teeth and use the restroom.

## 2016-11-16 NOTE — Op Note (Signed)
Colonoscopy showed that the entire colon and terminal ileum appeared unremarkable. Small internal and external hemorrhoids were noted on digital rectal exam and retroflexion. Source of rectal bleeding likely related to hemorrhoids. Recommend high fiber diet. Repeat colonoscopy in 10 years for screening. Okay to resume Xarelto for treatment of pulmonary embolism.  Tom HiresArya Keyasha Miah,MD [phone]

## 2016-11-16 NOTE — Anesthesia Preprocedure Evaluation (Addendum)
Anesthesia Evaluation  Patient identified by MRN, date of birth, ID band Patient awake    Reviewed: Allergy & Precautions, NPO status , Patient's Chart, lab work & pertinent test results  Airway Mallampati: I  TM Distance: >3 FB Neck ROM: full    Dental no notable dental hx. (+) Teeth Intact, Dental Advidsory Given   Pulmonary shortness of breath and with exertion,     + decreased breath sounds      Cardiovascular hypertension,  Rhythm:Regular Rate:Normal     Neuro/Psych Anxiety negative neurological ROS     GI/Hepatic negative GI ROS, Neg liver ROS,   Endo/Other  negative endocrine ROS  Renal/GU negative Renal ROS  negative genitourinary   Musculoskeletal   Abdominal Normal abdominal exam  (+)   Peds  Hematology negative hematology ROS (+)   Anesthesia Other Findings   Reproductive/Obstetrics                            Anesthesia Physical Anesthesia Plan  ASA: II  Anesthesia Plan: MAC   Post-op Pain Management:    Induction: Intravenous  Airway Management Planned: Natural Airway  Additional Equipment:   Intra-op Plan:   Post-operative Plan:   Informed Consent: I have reviewed the patients History and Physical, chart, labs and discussed the procedure including the risks, benefits and alternatives for the proposed anesthesia with the patient or authorized representative who has indicated his/her understanding and acceptance.   Dental Advisory Given  Plan Discussed with: CRNA and Surgeon  Anesthesia Plan Comments:        Anesthesia Quick Evaluation

## 2016-11-16 NOTE — Progress Notes (Signed)
Patient wheezing and states he felt short of breath, paged MD for nebulizer.

## 2016-11-16 NOTE — Progress Notes (Signed)
Patient has drink most of bowel prep. States that he is anxious about the procedure. Bowel movements are appearing to be clear and no signs of blood.

## 2016-11-16 NOTE — Op Note (Signed)
[facility] Patient Name: Aerik Polan Procedure Date : 11/16/2016 MRN: [id number] Attending MD: Kerin Salen , MD Date of Birth: 01-27-1956 CSN: [id number] Age: 61 Admit Type: Outpatient Procedure:                Colonoscopy Indications:              Last colonoscopy: 2012, Rectal bleeding Providers:                Kerin Salen, MD, Dwain Sarna, RN, Jacqulyn Liner,                            Technician Referring MD:              Medicines:                Monitored Anesthesia Care Complications:            No immediate complications. Estimated Blood Loss:     Estimated blood loss: none. Procedure:                Pre-Anesthesia Assessment:                           - Prior to the procedure, a History and Physical                            was performed, and patient medications and                            allergies were reviewed. The patient's tolerance of                            previous anesthesia was also reviewed. The risks                            and benefits of the procedure and the sedation                            options and risks were discussed with the patient.                            All questions were answered, and informed consent                            was obtained. Prior Anticoagulants: The patient has                            taken Xarelto (rivaroxaban), last dose was 3 days                            prior to procedure. ASA Grade Assessment: II - A                            patient with mild systemic disease. After reviewing  the risks and benefits, the patient was deemed in                            satisfactory condition to undergo the procedure.                           After obtaining informed consent, the colonoscope                            was passed under direct vision. Throughout the                            procedure, the patient's blood pressure, pulse, and                            oxygen  saturations were monitored continuously. The                            EC-[id number] ([id number]) scope was introduced through                            the anus and advanced to the the terminal ileum.                            The colonoscopy was performed without difficulty.                            The patient tolerated the procedure well. The                            quality of the bowel preparation was good. The                            terminal ileum, the ileocecal valve, the                            appendiceal orifice and the rectum were                            photographed. Scope In: 11:04:41 AM Scope Out: 11:15:06 AM Scope Withdrawal Time: 0 hours 7 minutes 14 seconds  Total Procedure Duration: 0 hours 10 minutes 25 seconds  Findings:      The perianal and digital rectal examinations were normal.      The colon (entire examined portion) appeared normal.      The terminal ileum appeared normal.      External and internal hemorrhoids were found during retroflexion. The       hemorrhoids were mild and small. Impression:               - The entire examined colon is normal.                           - The examined portion of the ileum was normal.                           -  External and internal hemorrhoids.                           - No specimens collected. Moderate Sedation:      Patient did not receive moderate sedation for this procedure, but       instead received monitored anesthesia care. Recommendation:           - High fiber diet.                           - Continue present medications.                           - Repeat colonoscopy in 10 years for screening                            purposes.                           - Resume Xarelto (rivaroxaban) at prior dose today.                            Refer to referring physician for further adjustment                            of therapy.                           - Return patient to hospital ward for ongoing  care. Procedure Code(s):        --- Professional ---                           [id number], Colonoscopy, flexible; diagnostic, including                            collection of specimen(s) by brushing or washing,                            when performed (separate procedure) Diagnosis Code(s):        --- Professional ---                           K64.8, Other hemorrhoids                           K62.5, Hemorrhage of anus and rectum CPT copyright 2016 American Medical Association. All rights reserved. The codes documented in this report are preliminary and upon coder review may  be revised to meet current compliance requirements. Kerin SalenArya Tiahna Cure, MD 11/16/2016 11:20:43 AM This report has been signed electronically. Number of Addenda: 0

## 2016-11-16 NOTE — Interval H&P Note (Signed)
History and Physical Interval Note: 61 year old Caucasian male with painless hematochezia, hemodynamic stable, needs to be on Xarelto for recently diagnosed pulmonary embolism. Last colonoscopy from 2012 noted to have polyps, not follow-up for surveillance colonoscopy 2017  11/16/2016 10:36 AM  Francena Hanlyobert Walz  has presented today for colonoscopy, with the diagnosis of hematochezia, newly diagnosed PE, needs anticoagulation  The various methods of treatment have been discussed with the patient and family. After consideration of risks, benefits and other options for treatment, the patient has consented to  Procedure(s): COLONOSCOPY WITH PROPOFOL (Left) as a surgical intervention .  The patient's history has been reviewed, patient examined, no change in status, stable for surgery.  I have reviewed the patient's chart and labs.  Questions were answered to the patient's satisfaction.     Kerin SalenArya Marra Fraga

## 2016-11-16 NOTE — Progress Notes (Signed)
Family Medicine Teaching Service Daily Progress Note Intern Pager: [phone]  Patient name: Tom [name] Medical record number: [id number] Date of birth: [birth year] Age: 61 y.o. Gender: male  Primary Care Provider: Juanita Laster, MD Consultants: GI Code Status: FULL   Pt Overview and Major Events to Date:  Admit 5/16   Assessment and Plan: Tom [name] is a 61 y.o. male presenting with bright red blood per rectum. PMH is significant for HTN, PE, anxiety.   Hematochezia in setting of recently starting Xarelto for PE on previous admission.  Discharged on 5/11.  Two episodes of bloody bowel movements, diarrhea associated. No symptoms significant for anemia. Hemoglobin stable on admission 15.4.  Patient states since he stopped taking the Xarelto he has not had any significant bloody bowel movements and took enteric coated aspirin per his doctor's recommendations.   With positive FOBT test.  PT/INR wnl.   -   IV NS @ 20 cc/hr  - GI consulted, appreciate recommendations >> NPO for diagnostic colonoscopy today  - Hgb 15.4 >16.1 > 14.3  -  Continue IV Protonix  - Heparin for anticoagulation in setting of recently discovered PE  -  Vitals per floor   PE: recent diagnosis of PE on 5/9 with CTA showing a small filling defects seen in lower lobe branch of right pulmonary artery concerning for small pulmonary embolus. Patient was discharged on Xarelto.  Currently subtherapeutic. Would need to be on anticoagulation for at least next 6 months.   - continue Heparin per pharmacy  HTN- BP normotensive on admission.  118/64 this AM.  SCr 1.26 on admission with BL 1.1-1.2.  SCr 1.25 this morning.  - Continue Losartan   Cough associated with CAP. Significantly improved since last admission at which time he was treated with Levaquin for CAP.  Has completed course of antibiotics outpatient and done well except for cough.   - Tessalon perles as needed   FEN/GI: NPO for colo  Prophylaxis: heparin  per pharmacy, Protonix   Disposition: Dispo pending clinical improvement.    Subjective:  Patient sitting in hospital chair, appears anxious for procedure.   Has finished his golytely, has not noticed any further blood in bowel movements.   Objective: Temp:  [97.8 F (36.6 C)-98.7 F (37.1 C)] 98.4 F (36.9 C) (05/18 1123) Pulse Rate:  [75-100] 88 (05/18 1140) Resp:  [15-20] 16 (05/18 1140) BP: (108-138)/(48-84) 108/48 (05/18 1140) SpO2:  [94 %-100 %] 100 % (05/18 1140) Weight:  [195 lb (88.5 kg)] 195 lb (88.5 kg) (05/18 1014) Physical Exam: General: 61 yo M, NAD Neck: supple Cardiovascular: RRR, no MRG noted Respiratory: CTAB, normal WOB  Gastrointestinal: soft, NTND, +bs MSK: No edema or tenderness noted  Derm: No rashes or ulcers  Neuro: AOx3, no focal deficits   Laboratory:  Recent Labs Lab 11/14/16 1817 11/15/16 0329 11/16/16 0455  WBC 13.2* 12.7* 8.8  HGB 15.4 16.0 14.3  HCT 44.3 46.3 41.5  PLT 240 243 226    Recent Labs Lab 11/14/16 1817 11/15/16 0329 11/16/16 0455  NA 137 136 135  K 4.4 4.1 3.7  CL 106 103 104  CO2 24 24 24   BUN 16 15 15   CREATININE 1.26* 1.25* 1.25*  CALCIUM 8.9 8.9 8.3*  PROT 6.2* 7.0  --   BILITOT 1.1 1.3*  --   ALKPHOS 65 66  --   ALT 26 26  --   AST 20 18  --   GLUCOSE 110* 118* 106*   Imaging/Diagnostic Tests:  No results found.   Freddrick MarchAmin, Toniyah Dilmore, MD 11/16/2016, 12:28 PM PGY-1, [facility] FPTS Intern pager: [phone], text pages welcome

## 2016-11-16 NOTE — H&P (Signed)
Tom [name] is an 61 y.o. male.   Chief Complaint: Painless rectal bleeding [id number] hematochezia, needs to be on Xarelto for recently diagnosed pulmonary embolism.  Past Medical History:  Diagnosis Date  . Anxiety   . Hypertension   . Multiple allergies   . PE (pulmonary thromboembolism) ([place]) 10/2015    Past Surgical History:  Procedure Laterality Date  . KNEE CARTILAGE SURGERY Right 1972  . MOUTH SURGERY      History reviewed. No pertinent family history. Social History:  reports that he has never smoked. He has never used smokeless tobacco. He reports that he drinks about 0.6 oz of alcohol per week . He reports that he does not use drugs.  Allergies:  Allergies  Allergen Reactions  . Ampicillin Other (See Comments)    Childhood; Unsure of reaction.  . Sulfa Antibiotics Other (See Comments)    unspecified    Medications Prior to Admission  Medication Sig Dispense Refill  . benzonatate (TESSALON) 100 MG capsule Take 1 capsule (100 mg total) by mouth 3 (three) times daily as needed for cough. 20 capsule 0  . chlorpheniramine (CHLOR-TRIMETON) 4 MG tablet Take 8 mg by mouth at bedtime.    . diazepam (VALIUM) 5 MG tablet Take 1 tablet (5 mg total) by mouth 2 (two) times daily as needed for anxiety. 10 tablet 0  . loratadine (CLARITIN) 10 MG tablet Take 10 mg by mouth daily as needed for allergies.    Marland Kitchen losartan (COZAAR) 50 MG tablet Take 50 mg by mouth daily.    . montelukast (SINGULAIR) 10 MG tablet Take 10 mg by mouth at bedtime.    . Rivaroxaban (XARELTO) 15 MG TABS tablet Take 1 tablet (15 mg total) by mouth 2 (two) times daily with a meal. 42 tablet 0  . levofloxacin (LEVAQUIN) 750 MG tablet Take 1 tablet (750 mg total) by mouth daily. (Patient not taking: Reported on 11/15/2016) 5 tablet 0  . [START ON 11/29/2016] rivaroxaban (XARELTO) 20 MG TABS tablet Take 1 tablet (20 mg total) by mouth daily with supper. 30 tablet 0    Results for orders placed or performed  during the hospital encounter of 11/14/16 (from the past 48 hour(s))  Comprehensive metabolic panel     Status: Abnormal   Collection Time: 11/14/16  6:17 PM  Result Value Ref Range   Sodium 137 135 - 145 mmol/L   Potassium 4.4 3.5 - 5.1 mmol/L   Chloride 106 101 - 111 mmol/L   CO2 24 22 - 32 mmol/L   Glucose, Bld 110 (H) 65 - 99 mg/dL   BUN 16 6 - 20 mg/dL   Creatinine, Ser 1.26 (H) 0.61 - 1.24 mg/dL   Calcium 8.9 8.9 - 10.3 mg/dL   Total Protein 6.2 (L) 6.5 - 8.1 g/dL   Albumin 3.8 3.5 - 5.0 g/dL   AST 20 15 - 41 U/L   ALT 26 17 - 63 U/L   Alkaline Phosphatase 65 38 - 126 U/L   Total Bilirubin 1.1 0.3 - 1.2 mg/dL   GFR calc non Af Amer 60 (L) >60 mL/min   GFR calc Af Amer >60 >60 mL/min    Comment: (NOTE) The eGFR has been calculated using the CKD EPI equation. This calculation has not been validated in all clinical situations. eGFR's persistently <60 mL/min signify possible Chronic Kidney Disease.    Anion gap 7 5 - 15  CBC     Status: Abnormal   Collection Time: 11/14/16  6:17  PM  Result Value Ref Range   WBC 13.2 (H) 4.0 - 10.5 K/uL   RBC 4.96 4.22 - 5.81 MIL/uL   Hemoglobin 15.4 13.0 - 17.0 g/dL   HCT 44.3 39.0 - 52.0 %   MCV 89.3 78.0 - 100.0 fL   MCH 31.0 26.0 - 34.0 pg   MCHC 34.8 30.0 - 36.0 g/dL   RDW 13.2 11.5 - 15.5 %   Platelets 240 150 - 400 K/uL  Protime-INR - (order if Patient is taking Coumadin / Warfarin)     Status: None   Collection Time: 11/14/16  6:17 PM  Result Value Ref Range   Prothrombin Time 12.9 11.4 - 15.2 seconds   INR 0.97   Type and screen [place]     Status: None   Collection Time: 11/14/16  7:04 PM  Result Value Ref Range   ABO/RH(D) O POS    Antibody Screen NEG    Sample Expiration 11/17/2016   ABO/Rh     Status: None   Collection Time: 11/14/16  7:04 PM  Result Value Ref Range   ABO/RH(D) O POS   POC occult blood, ED     Status: Abnormal   Collection Time: 11/15/16  1:10 AM  Result Value Ref Range    Fecal Occult Bld POSITIVE (A) NEGATIVE  Comprehensive metabolic panel     Status: Abnormal   Collection Time: 11/15/16  3:29 AM  Result Value Ref Range   Sodium 136 135 - 145 mmol/L   Potassium 4.1 3.5 - 5.1 mmol/L   Chloride 103 101 - 111 mmol/L   CO2 24 22 - 32 mmol/L   Glucose, Bld 118 (H) 65 - 99 mg/dL   BUN 15 6 - 20 mg/dL   Creatinine, Ser 1.25 (H) 0.61 - 1.24 mg/dL   Calcium 8.9 8.9 - 10.3 mg/dL   Total Protein 7.0 6.5 - 8.1 g/dL   Albumin 4.0 3.5 - 5.0 g/dL   AST 18 15 - 41 U/L   ALT 26 17 - 63 U/L   Alkaline Phosphatase 66 38 - 126 U/L   Total Bilirubin 1.3 (H) 0.3 - 1.2 mg/dL   GFR calc non Af Amer >60 >60 mL/min   GFR calc Af Amer >60 >60 mL/min    Comment: (NOTE) The eGFR has been calculated using the CKD EPI equation. This calculation has not been validated in all clinical situations. eGFR's persistently <60 mL/min signify possible Chronic Kidney Disease.    Anion gap 9 5 - 15  CBC     Status: Abnormal   Collection Time: 11/15/16  3:29 AM  Result Value Ref Range   WBC 12.7 (H) 4.0 - 10.5 K/uL   RBC 5.17 4.22 - 5.81 MIL/uL   Hemoglobin 16.0 13.0 - 17.0 g/dL   HCT 46.3 39.0 - 52.0 %   MCV 89.6 78.0 - 100.0 fL   MCH 30.9 26.0 - 34.0 pg   MCHC 34.6 30.0 - 36.0 g/dL   RDW 13.7 11.5 - 15.5 %   Platelets 243 150 - 400 K/uL  Protime-INR     Status: None   Collection Time: 11/15/16  3:29 AM  Result Value Ref Range   Prothrombin Time 13.8 11.4 - 15.2 seconds   INR 1.06   Heparin level (unfractionated)     Status: Abnormal   Collection Time: 11/15/16  3:29 AM  Result Value Ref Range   Heparin Unfractionated <0.10 (L) 0.30 - 0.70 IU/mL    Comment:  IF HEPARIN RESULTS ARE BELOW EXPECTED VALUES, AND PATIENT DOSAGE HAS BEEN CONFIRMED, SUGGEST FOLLOW UP TESTING OF ANTITHROMBIN III LEVELS.   APTT     Status: None   Collection Time: 11/15/16  3:29 AM  Result Value Ref Range   aPTT 32 24 - 36 seconds  Heparin level (unfractionated)     Status: None    Collection Time: 11/15/16  9:26 AM  Result Value Ref Range   Heparin Unfractionated 0.64 0.30 - 0.70 IU/mL    Comment:        IF HEPARIN RESULTS ARE BELOW EXPECTED VALUES, AND PATIENT DOSAGE HAS BEEN CONFIRMED, SUGGEST FOLLOW UP TESTING OF ANTITHROMBIN III LEVELS.   Heparin level (unfractionated)     Status: None   Collection Time: 11/15/16  4:21 PM  Result Value Ref Range   Heparin Unfractionated 0.50 0.30 - 0.70 IU/mL    Comment:        IF HEPARIN RESULTS ARE BELOW EXPECTED VALUES, AND PATIENT DOSAGE HAS BEEN CONFIRMED, SUGGEST FOLLOW UP TESTING OF ANTITHROMBIN III LEVELS.   CBC     Status: None   Collection Time: 11/16/16  4:55 AM  Result Value Ref Range   WBC 8.8 4.0 - 10.5 K/uL   RBC 4.60 4.22 - 5.81 MIL/uL   Hemoglobin 14.3 13.0 - 17.0 g/dL   HCT 41.5 39.0 - 52.0 %   MCV 90.2 78.0 - 100.0 fL   MCH 31.1 26.0 - 34.0 pg   MCHC 34.5 30.0 - 36.0 g/dL   RDW 13.5 11.5 - 15.5 %   Platelets 226 150 - 400 K/uL  Basic metabolic panel     Status: Abnormal   Collection Time: 11/16/16  4:55 AM  Result Value Ref Range   Sodium 135 135 - 145 mmol/L   Potassium 3.7 3.5 - 5.1 mmol/L   Chloride 104 101 - 111 mmol/L   CO2 24 22 - 32 mmol/L   Glucose, Bld 106 (H) 65 - 99 mg/dL   BUN 15 6 - 20 mg/dL   Creatinine, Ser 1.25 (H) 0.61 - 1.24 mg/dL   Calcium 8.3 (L) 8.9 - 10.3 mg/dL   GFR calc non Af Amer >60 >60 mL/min   GFR calc Af Amer >60 >60 mL/min    Comment: (NOTE) The eGFR has been calculated using the CKD EPI equation. This calculation has not been validated in all clinical situations. eGFR's persistently <60 mL/min signify possible Chronic Kidney Disease.    Anion gap 7 5 - 15   No results found.  ROS  Unchanged since consult note yesterday.  Blood pressure 138/77, pulse 98, temperature 98.7 F (37.1 C), temperature source Oral, resp. rate 15, height 6' (1.829 m), weight 88.5 kg (195 lb), SpO2 96 %. Physical Exam  Unchanged since consult note  yesterday.  Assessment/Plan Painless hematochezia Xarelto on hold for 3 days. Diagnostic colonoscopy today.  Ronnette Juniper, MD 11/16/2016, 10:37 AM

## 2016-11-17 ENCOUNTER — Encounter (HOSPITAL_COMMUNITY): Payer: Self-pay | Admitting: Gastroenterology

## 2016-11-17 LAB — BASIC METABOLIC PANEL
ANION GAP: 7 (ref 5–15)
BUN: 15 mg/dL (ref 6–20)
CALCIUM: 8.4 mg/dL — AB (ref 8.9–10.3)
CO2: 25 mmol/L (ref 22–32)
CREATININE: 1.19 mg/dL (ref 0.61–1.24)
Chloride: 104 mmol/L (ref 101–111)
Glucose, Bld: 103 mg/dL — ABNORMAL HIGH (ref 65–99)
Potassium: 3.8 mmol/L (ref 3.5–5.1)
SODIUM: 136 mmol/L (ref 135–145)

## 2016-11-17 LAB — CBC
HCT: 39.7 % (ref 39.0–52.0)
Hemoglobin: 13.2 g/dL (ref 13.0–17.0)
MCH: 29.9 pg (ref 26.0–34.0)
MCHC: 33.2 g/dL (ref 30.0–36.0)
MCV: 90 fL (ref 78.0–100.0)
PLATELETS: 240 10*3/uL (ref 150–400)
RBC: 4.41 MIL/uL (ref 4.22–5.81)
RDW: 13.4 % (ref 11.5–15.5)
WBC: 8.7 10*3/uL (ref 4.0–10.5)

## 2016-11-17 MED ORDER — RIVAROXABAN 20 MG PO TABS: 20.0000 mg | ORAL_TABLET | Freq: Every day | ORAL | 0 refills | Status: AC

## 2016-11-17 MED ORDER — BENZONATATE 100 MG PO CAPS: 100.0000 mg | ORAL_CAPSULE | Freq: Three times a day (TID) | ORAL | 0 refills | Status: AC | PRN

## 2016-11-17 MED ORDER — FAMOTIDINE 20 MG PO TABS: 20.0000 mg | ORAL_TABLET | Freq: Two times a day (BID) | ORAL | 0 refills | Status: AC

## 2016-11-17 MED ORDER — ALBUTEROL SULFATE (2.5 MG/3ML) 0.083% IN NEBU: 2.5000 mg | INHALATION_SOLUTION | RESPIRATORY_TRACT | 0 refills | Status: AC | PRN

## 2016-11-17 MED ORDER — RIVAROXABAN 15 MG PO TABS: 15.0000 mg | ORAL_TABLET | Freq: Two times a day (BID) | ORAL | 0 refills | Status: AC

## 2016-11-17 NOTE — Care Management Note (Signed)
Case Management Note  Patient Details  Name: Tom HanlyRobert [name] MRN: [id number] Date of Birth: 10-Apr-1956  Subjective/Objective: CM received consult for this 61 y.o. [name] who will be discharged today on Xarelto. This is the second time he has been given Xarelto so he is not eligible for a Xarelto Co-Pay card. [facility] and ask for the amount of his co-pay.                    Action/Plan: No CM needs at this time   Expected Discharge Date:  11/17/16               Expected Discharge Plan:     In-House Referral:     Discharge planning Services     Post Acute Care Choice:    Choice offered to:     DME Arranged:    DME Agency:     HH Arranged:    HH Agency:     Status of Service:     If discussed at [employer] Meetings, dates discussed:    Additional Comments:  Yvone NeuCrutchfield, Tom Durr M, RN 11/17/2016, 3:50 PM

## 2016-11-17 NOTE — Discharge Summary (Signed)
Pt got discharged, discharge instructions provided and patient showed understanding to it, IV taken out,Telemonitor DC,pt left unit in wheelchair with all of the belongings. 

## 2016-11-17 NOTE — Progress Notes (Signed)
ANTICOAGULATION CONSULT NOTE   Pharmacy Consult for Xarelto  Indication: Recent PE on 5/9 CTA (Xarelto PTA)  Allergies  Allergen Reactions  . Ampicillin Other (See Comments)    Childhood; Unsure of reaction.  . Sulfa Antibiotics Other (See Comments)    unspecified    Patient Measurements: Height: 6' (182.9 cm) Weight: 196 lb 4.8 oz (89 kg) IBW/kg (Calculated) : 77.6 Heparin Dosing Weight: 91 kg  Vital Signs: Temp: 97.6 F (36.4 C) (05/19 0325) Temp Source: Oral (05/19 0325) BP: 112/75 (05/19 0325) Pulse Rate: 81 (05/19 0325)  Assessment: 61 y.o. M presents with BRPBR. Pt on Xarelto PTA for recent PE 5/9 on CTA. Patient was discharged on Xarelto, stopped taking x2 days before he was re-admitted.   Patient transitioned to heparin gtt while awaiting procedure. Now s/p colonoscopy with findings of hemorrhoids, which GI notes is the likely source of bleeding. Per GI and FM, okay to resume Xarelto. Due to interruption in previous regimen, will start regimen over from the beginning. CBC stable, no overt s/s bleeding noted.   Plan:  Resume Xarelto 15mg  BID x21 days, then 20 mg daily  Monitor CBC and for s/s bleeding Pharmacy will sign off and monitor.   Bailey MechEmily Stewart,  PharmD Pharmacy Resident  Pager [phone] 11/17/16 10:33 AM

## 2016-11-17 NOTE — Progress Notes (Signed)
Family Medicine Teaching Service Daily Progress Note Intern Pager: [phone]  Patient name: Tom [name] Medical record number: [id number] Date of birth: 08-05-1955 Age: 61 y.o. Gender: male  Primary Care Provider: Juanita Lasteripton, John S, MD Consultants: GI Code Status: FULL   Pt Overview and Major Events to Date:  Admit 5/16  Colonoscopy 5/18  Assessment and Plan: Tom [name] is a 61 y.o. male presenting with bright red blood per rectum. PMH is significant for HTN, PE, anxiety.   Hematochezia in setting of recently starting Xarelto for PE on previous admission. Colonoscopy was unremarkable. DRE revealed internal and external hemorrhoids, thought to be the source of the bleeding. Hemoglobin has remained stable throughout admission.  - GI consulted, appreciate recommendations; repeat screening colonoscopy in 10 years  - have resumed Xarelto  -high fiber diet  - Pepcid   PE: recent diagnosis of PE on 5/9 with CTA showing a small filling defects seen in lower lobe branch of right pulmonary artery concerning for small pulmonary embolus. Patient was discharged on Xarelto.  Currently subtherapeutic. Would need to be on anticoagulation for at least next 6 months.   - Xarelto at 15 mg BID x21 days, then 20 mg daily   HTN- BP normotensive. Cr at baseline.  - Continue Losartan   Cough associated with CAP. Significantly improved since last admission at which time he was treated with Levaquin for CAP.  Has completed course of antibiotics outpatient and done well except for cough.   - Tessalon perles as needed  -Pepcid as cough may be related to GERD   FEN/GI: Regular diet  Prophylaxis:  Xarelto   Disposition: plan for home today  Subjective:  No further bloody bowel movements. Still having cough. No other complaints, otherwise feels well.    Objective: Temp:  [97.6 F (36.4 C)-98.7 F (37.1 C)] 97.6 F (36.4 C) (05/19 0325) Pulse Rate:  [75-98] 81 (05/19 0325) Resp:  [15-18] 18  (05/19 0325) BP: (108-138)/(48-77) 112/75 (05/19 0325) SpO2:  [94 %-100 %] 96 % (05/19 0325) Weight:  [195 lb (88.5 kg)-196 lb 4.8 oz (89 kg)] 196 lb 4.8 oz (89 kg) (05/19 0325) Physical Exam: General: 61 yo M, NAD Neck: supple Cardiovascular: RRR, no MRG noted Respiratory: CTAB, normal WOB  Gastrointestinal: soft, NTND, +bs MSK: No edema or tenderness noted  Derm: No rashes or ulcers  Neuro: AOx3, no focal deficits   Laboratory:  Recent Labs Lab 11/15/16 0329 11/16/16 0455 11/17/16 0408  WBC 12.7* 8.8 8.7  HGB 16.0 14.3 13.2  HCT 46.3 41.5 39.7  PLT 243 226 240    Recent Labs Lab 11/14/16 1817 11/15/16 0329 11/16/16 0455 11/17/16 0408  NA 137 136 135 136  K 4.4 4.1 3.7 3.8  CL 106 103 104 104  CO2 24 24 24 25   BUN 16 15 15 15   CREATININE 1.26* 1.25* 1.25* 1.19  CALCIUM 8.9 8.9 8.3* 8.4*  PROT 6.2* 7.0  --   --   BILITOT 1.1 1.3*  --   --   ALKPHOS 65 66  --   --   ALT 26 26  --   --   AST 20 18  --   --   GLUCOSE 110* 118* 106* 103*   Imaging/Diagnostic Tests: No results found.   Arvilla MarketWallace, Shaquela Weichert Lauren, DO 11/17/2016, 9:18 AM PGY-2, [facility] FPTS Intern pager: [phone], text pages welcome

## 2016-11-19 ENCOUNTER — Other Ambulatory Visit: Payer: Self-pay | Admitting: Family Medicine

## 2016-11-19 NOTE — Discharge Summary (Signed)
Family Medicine Teaching [facility] Discharge Summary  Patient name: Tom [name] Medical record number: [id number] Date of birth: [birth year] Age: 61 y.o. Gender: male Date of Admission: 11/14/2016  Date of Discharge: 11/17/2016 Admitting Physician: Doreene Eland, MD  Primary Care Provider: Juanita Laster, MD Consultants: GI   Indication for Hospitalization: bright red blood per rectum   Discharge Diagnoses/Problem List:  Hematochezia Pulmonary embolus HTN  Disposition: Home  Discharge Condition: Stable, improved   Discharge Exam:  General: 61 yo M, NAD Neck: supple Cardiovascular: RRR, no MRG noted Respiratory: CTAB, normal WOB  Gastrointestinal: soft, NTND, +bs MSK: No edema or tenderness noted  Derm: No rashes or ulcers  Neuro: AOx3, no focal deficits   Brief Hospital Course:  Tom [name] is a 61 yo male who presented with GI bleed.  Patient recently discharged from [facility] on 5/11 and found to have a pulmonary embolus for which he was started on Xarelto. Patient states he took Xarelto as prescribed for the first several days and noticed bright red blood in his stool.  Patient notes he was not constipated when these episodes occurred.  He took Xarelto the following day and noticed it again at which time he discontinued it and saw his doctor.  He took enteric coated aspirin and was advised to come to the hospital for further evaluation.    Hemoglobin on admission stable at 15.4 and FOBT +.  IV Protonix was given and he was started on Heparin for anticoagulation in the meantime.  Patient was kept NPO for pending procedures and GI was consulted.  Patient did not experience any further episodes of bloody bowel movements while hospitalized and hemoglobin remained stable.  GI performed a colonoscopy the following day which revealed external and internal hemorrhoids during retroflexion.  He was cleared for discharge with outpatient follow-up.   Xarelto was restarted however he  was advised to seek help if he notes any further bleeding.  Patient to restart Xarelto regimen of 15 mg BID x 21 days followed by 20 mg daily due to interruption in anticoagulation regimen.  He was discharged home in stable condition.    Issues for Follow Up:   1.  Will need to entirely start over the Xarelto regimen due to interruption.  Discharged with 15 mg twice daily for 21 days, followed by 20mg  daily after that. Please ensure he is taking this as prescribed.  2. CBC for hemoglobin.  3. Follow up cough for resolution.  No history of asthma/COPD.  Was discharged with albuterol inhaler on this admission.   Significant Procedures:  Colonoscopy   Significant Labs and Imaging:   Recent Labs Lab 11/15/16 0329 11/16/16 0455 11/17/16 0408  WBC 12.7* 8.8 8.7  HGB 16.0 14.3 13.2  HCT 46.3 41.5 39.7  PLT 243 226 240    Recent Labs Lab 11/14/16 1817 11/15/16 0329 11/16/16 0455 11/17/16 0408  NA 137 136 135 136  K 4.4 4.1 3.7 3.8  CL 106 103 104 104  CO2 24 24 24 25   GLUCOSE 110* 118* 106* 103*  BUN 16 15 15 15   CREATININE 1.26* 1.25* 1.25* 1.19  CALCIUM 8.9 8.9 8.3* 8.4*  ALKPHOS 65 66  --   --   AST 20 18  --   --   ALT 26 26  --   --   ALBUMIN 3.8 4.0  --   --     Results/Tests Pending at Time of Discharge: None  Discharge Medications:  Allergies as of  11/17/2016      Reactions   Ampicillin Other (See Comments)   Childhood; Unsure of reaction.   Sulfa Antibiotics Other (See Comments)   unspecified      Medication List    STOP taking these medications   levofloxacin 750 MG tablet Commonly known as:  LEVAQUIN     TAKE these medications   albuterol (2.5 MG/3ML) 0.083% nebulizer solution Commonly known as:  PROVENTIL Take 3 mLs (2.5 mg total) by nebulization every 4 (four) hours as needed for wheezing or shortness of breath.   benzonatate 100 MG capsule Commonly known as:  TESSALON Take 1 capsule (100 mg total) by mouth 3 (three) times daily as needed for  cough.   chlorpheniramine 4 MG tablet Commonly known as:  CHLOR-TRIMETON Take 8 mg by mouth at bedtime.   diazepam 5 MG tablet Commonly known as:  VALIUM Take 1 tablet (5 mg total) by mouth 2 (two) times daily as needed for anxiety.   famotidine 20 MG tablet Commonly known as:  PEPCID Take 1 tablet (20 mg total) by mouth 2 (two) times daily.   loratadine 10 MG tablet Commonly known as:  CLARITIN Take 10 mg by mouth daily as needed for allergies.   losartan 50 MG tablet Commonly known as:  COZAAR Take 50 mg by mouth daily.   montelukast 10 MG tablet Commonly known as:  SINGULAIR Take 10 mg by mouth at bedtime.   Rivaroxaban 15 MG Tabs tablet Commonly known as:  XARELTO Take 1 tablet (15 mg total) by mouth 2 (two) times daily with a meal. What changed:  Another medication with the same name was changed. Make sure you understand how and when to take each.   rivaroxaban 20 MG Tabs tablet Commonly known as:  XARELTO Take 1 tablet (20 mg total) by mouth daily with supper. Start taking on:  12/07/2016 What changed:  These instructions start on 12/07/2016. If you are unsure what to do until then, ask your doctor or other care provider.      Discharge Instructions: Please refer to Patient Instructions section of EMR for full details.  Patient was counseled important signs and symptoms that should prompt return to medical care, changes in medications, dietary instructions, activity restrictions, and follow up appointments.   Follow-Up Appointments: Follow-up Information    Juanita Lasteripton, John S, MD.   Specialty:  Family Medicine Contact information: [address] [facility] [place] [address] [phone]          Freddrick MarchAmin, Lulabelle Desta, MD 11/19/2016, 1:49 PM PGY-1, [facility]

## 2016-11-22 ENCOUNTER — Telehealth: Payer: Self-pay | Admitting: *Deleted

## 2016-11-22 NOTE — Telephone Encounter (Signed)
It looks like it was written by you on 5/19 when he was in-patient. Kinnie FeilL. Merville Hijazi, RN, BSN

## 2016-11-22 NOTE — Telephone Encounter (Signed)
I'm not sure how this got routed to me but it looks like this patient's PCP is not part of our practice.   Marcy Sirenatherine Charon Akamine, D.O. 11/22/2016, 10:41 AM PGY-2, [facility]

## 2016-11-22 NOTE — Telephone Encounter (Signed)
Katrina from [facility] [address] [phone] called asking for change in albuterol. Patient does not have compressor/nebulizer and would like MDI instead. Kinnie FeilL. Luvena Wentling, RN, BSN

## 2016-11-27 NOTE — Telephone Encounter (Signed)
Katrina called again. I spoke with Tamika and was advised to her to contact pt's PCP. ep

## 2016-12-07 NOTE — Addendum Note (Signed)
Addendum  created 12/07/16 1250 by Tonette Koehne, MD   Sign clinical note    

## 2016-12-26 ENCOUNTER — Other Ambulatory Visit: Payer: Self-pay | Admitting: Internal Medicine

## 2019-05-30 ENCOUNTER — Emergency Department (HOSPITAL_COMMUNITY): Payer: 59

## 2019-05-30 ENCOUNTER — Other Ambulatory Visit: Payer: Self-pay

## 2019-05-30 ENCOUNTER — Emergency Department (HOSPITAL_COMMUNITY)
Admission: EM | Admit: 2019-05-30 | Discharge: 2019-05-31 | Disposition: A | Discharge status: Home / Self Care | Payer: 59 | Attending: Emergency Medicine | Admitting: Emergency Medicine

## 2019-05-30 ENCOUNTER — Encounter (HOSPITAL_COMMUNITY): Payer: Self-pay | Admitting: Emergency Medicine

## 2019-05-30 DIAGNOSIS — R202 Paresthesia of skin: Secondary | ICD-10-CM | POA: Insufficient documentation

## 2019-05-30 DIAGNOSIS — R233 Spontaneous ecchymoses: Secondary | ICD-10-CM | POA: Insufficient documentation

## 2019-05-30 DIAGNOSIS — R58 Hemorrhage, not elsewhere classified: Secondary | ICD-10-CM

## 2019-05-30 DIAGNOSIS — I10 Essential (primary) hypertension: Secondary | ICD-10-CM | POA: Diagnosis not present

## 2019-05-30 DIAGNOSIS — Z7901 Long term (current) use of anticoagulants: Secondary | ICD-10-CM | POA: Insufficient documentation

## 2019-05-30 DIAGNOSIS — M79622 Pain in left upper arm: Secondary | ICD-10-CM | POA: Diagnosis present

## 2019-05-30 DIAGNOSIS — Z79899 Other long term (current) drug therapy: Secondary | ICD-10-CM | POA: Diagnosis not present

## 2019-05-30 DIAGNOSIS — M7989 Other specified soft tissue disorders: Secondary | ICD-10-CM | POA: Diagnosis not present

## 2019-05-30 NOTE — ED Triage Notes (Signed)
Pt was exercising and he started flexing his muscle on his arm later after that he started having pain and today he woke up with there arm swollen and bruise all over.

## 2019-05-31 ENCOUNTER — Encounter (HOSPITAL_COMMUNITY): Payer: Self-pay | Admitting: Emergency Medicine

## 2019-05-31 ENCOUNTER — Emergency Department (HOSPITAL_COMMUNITY): Payer: 59

## 2019-05-31 DIAGNOSIS — M7989 Other specified soft tissue disorders: Secondary | ICD-10-CM | POA: Insufficient documentation

## 2019-05-31 LAB — CBC WITH DIFFERENTIAL/PLATELET
Abs Immature Granulocytes: 0.04 10*3/uL (ref 0.00–0.07)
Basophils Absolute: 0.1 10*3/uL (ref 0.0–0.1)
Basophils Relative: 1 %
Eosinophils Absolute: 0.1 10*3/uL (ref 0.0–0.5)
Eosinophils Relative: 1 %
HCT: 42.9 % (ref 39.0–52.0)
Hemoglobin: 15.6 g/dL (ref 13.0–17.0)
Immature Granulocytes: 0 %
Lymphocytes Relative: 24 %
Lymphs Abs: 2.9 10*3/uL (ref 0.7–4.0)
MCH: 34.9 pg — ABNORMAL HIGH (ref 26.0–34.0)
MCHC: 36.4 g/dL — ABNORMAL HIGH (ref 30.0–36.0)
MCV: 96 fL (ref 80.0–100.0)
Monocytes Absolute: 0.8 10*3/uL (ref 0.1–1.0)
Monocytes Relative: 6 %
Neutro Abs: 8.1 10*3/uL — ABNORMAL HIGH (ref 1.7–7.7)
Neutrophils Relative %: 68 %
Platelets: 261 10*3/uL (ref 150–400)
RBC: 4.47 MIL/uL (ref 4.22–5.81)
RDW: 12.2 % (ref 11.5–15.5)
WBC: 12 10*3/uL — ABNORMAL HIGH (ref 4.0–10.5)
nRBC: 0 % (ref 0.0–0.2)

## 2019-05-31 LAB — I-STAT CHEM 8, ED
BUN: 15 mg/dL (ref 8–23)
Calcium, Ion: 1.14 mmol/L — ABNORMAL LOW (ref 1.15–1.40)
Chloride: 104 mmol/L (ref 98–111)
Creatinine, Ser: 1.2 mg/dL (ref 0.61–1.24)
Glucose, Bld: 116 mg/dL — ABNORMAL HIGH (ref 70–99)
HCT: 42 % (ref 39.0–52.0)
Hemoglobin: 14.3 g/dL (ref 13.0–17.0)
Potassium: 3.8 mmol/L (ref 3.5–5.1)
Sodium: 141 mmol/L (ref 135–145)
TCO2: 26 mmol/L (ref 22–32)

## 2019-05-31 MED ORDER — FENTANYL CITRATE (PF) 100 MCG/2ML IJ SOLN
50.0000 ug | Freq: Once | INTRAMUSCULAR | Status: AC
  Administered 2019-05-31: 50 ug via INTRAVENOUS
  Filled 2019-05-31: qty 2

## 2019-05-31 MED ORDER — IOHEXOL 300 MG/ML  SOLN
100.0000 mL | Freq: Once | INTRAMUSCULAR | Status: AC | PRN
  Administered 2019-05-31: 05:00:00 100 mL via INTRAVENOUS

## 2019-05-31 MED ORDER — ACETAMINOPHEN 500 MG PO TABS
1000.0000 mg | ORAL_TABLET | Freq: Once | ORAL | Status: AC
  Administered 2019-05-31: 02:00:00 1000 mg via ORAL
  Filled 2019-05-31: qty 2

## 2019-05-31 NOTE — Progress Notes (Signed)
Orthopedic Tech Progress Note Patient Details:  Tom [name] [birth year] [id number]  Ortho Devices Ortho Device/Splint Location: carder pillow Ortho Device/Splint Interventions: Application   Post Interventions Patient Tolerated: Well Instructions Provided: Care of device   Tom [name] 05/31/2019, 8:08 AM

## 2019-05-31 NOTE — ED Provider Notes (Signed)
[facility] EMERGENCY DEPARTMENT Provider Note   CSN: [id number] Arrival date & time: 05/30/19  2019     History   Chief Complaint Chief Complaint  Patient presents with  . Arm Injury    HPI Tom [name] is a 63 y.o. male.     The history is provided by the patient.  Extremity Pain This is a new problem. The current episode started 6 to 12 hours ago. The problem occurs constantly. The problem has not changed since onset.Pertinent negatives include no chest pain, no abdominal pain, no headaches and no shortness of breath. Nothing aggravates the symptoms. Nothing relieves the symptoms. He has tried nothing for the symptoms. The treatment provided no relief.  Patient on was exercising and flexed LUE to see his progress and then noted bruising and swelling which became progressively worse with some tingling in the L hand.    Past Medical History:  Diagnosis Date  . Anxiety   . Hypertension   . Multiple allergies   . PE (pulmonary thromboembolism) ([place]) 10/2015    Patient Active Problem List   Diagnosis Date Noted  . Rectal bleeding   . Hematochezia due to medication 11/15/2016  . Cough   . Pulmonary embolism ([place]) 11/07/2016  . Dyspnea   . Hypoxia   . Tachycardia     Past Surgical History:  Procedure Laterality Date  . COLONOSCOPY WITH PROPOFOL Left 11/16/2016   Procedure: COLONOSCOPY WITH PROPOFOL;  Surgeon: Ronnette Juniper, MD;  Location: [facility];  Service: Gastroenterology;  Laterality: Left;  . KNEE CARTILAGE SURGERY Right 1972  . MOUTH SURGERY          Home Medications    Prior to Admission medications   Medication Sig Start Date End Date Taking? Authorizing Provider  albuterol (PROVENTIL) (2.5 MG/3ML) 0.083% nebulizer solution Take 3 mLs (2.5 mg total) by nebulization every 4 (four) hours as needed for wheezing or shortness of breath. 11/17/16   Nicolette Bang, DO  benzonatate (TESSALON) 100 MG capsule Take 1 capsule (100  mg total) by mouth 3 (three) times daily as needed for cough. 11/17/16   Nicolette Bang, DO  chlorpheniramine (CHLOR-TRIMETON) 4 MG tablet Take 8 mg by mouth at bedtime.    [provider]  diazepam (VALIUM) 5 MG tablet Take 1 tablet (5 mg total) by mouth 2 (two) times daily as needed for anxiety. 11/09/16   Lovenia Kim, MD  famotidine (PEPCID) 20 MG tablet Take 1 tablet (20 mg total) by mouth 2 (two) times daily. 11/17/16   Nicolette Bang, DO  loratadine (CLARITIN) 10 MG tablet Take 10 mg by mouth daily as needed for allergies.    [provider]  losartan (COZAAR) 50 MG tablet Take 50 mg by mouth daily. 11/01/16   [provider]  montelukast (SINGULAIR) 10 MG tablet Take 10 mg by mouth at bedtime. 11/01/16   [provider]  Rivaroxaban (XARELTO) 15 MG TABS tablet Take 1 tablet (15 mg total) by mouth 2 (two) times daily with a meal. 11/17/16   Nicolette Bang, DO  rivaroxaban (XARELTO) 20 MG TABS tablet Take 1 tablet (20 mg total) by mouth daily with supper. 12/07/16   Nicolette Bang, DO    Family History No family history on file.  Social History Social History   Tobacco Use  . Smoking status: Never Smoker  . Smokeless tobacco: Never Used  Substance Use Topics  . Alcohol use: Yes    Alcohol/week: 1.0  standard drinks    Types: 1 Cans of beer per week    Comment: weekly  . Drug use: No     Allergies   Ampicillin and Sulfa antibiotics   Review of Systems Review of Systems  Constitutional: Negative for unexpected weight change.  HENT: Negative for congestion.   Eyes: Negative for visual disturbance.  Respiratory: Negative for shortness of breath.   Cardiovascular: Negative for chest pain.  Gastrointestinal: Negative for abdominal pain.  Genitourinary: Negative for difficulty urinating.  Musculoskeletal: Positive for arthralgias and joint swelling.  Neurological: Negative for headaches.  Hematological:  Bruises/bleeds easily.  Psychiatric/Behavioral: Negative for agitation.  All other systems reviewed and are negative.    Physical Exam Updated Vital Signs BP (!) 125/111   Pulse 98   Temp 98.6 F (37 C) (Oral)   Resp 18   SpO2 96%   Physical Exam Vitals signs and nursing note reviewed.  Constitutional:      General: He is not in acute distress.    Appearance: He is normal weight.  HENT:     Head: Normocephalic and atraumatic.     Nose: Nose normal.  Eyes:     Conjunctiva/sclera: Conjunctivae normal.     Pupils: Pupils are equal, round, and reactive to light.  Neck:     Musculoskeletal: Normal range of motion and neck supple.  Cardiovascular:     Rate and Rhythm: Normal rate and regular rhythm.     Pulses: Normal pulses.     Heart sounds: Normal heart sounds.  Pulmonary:     Effort: Pulmonary effort is normal.     Breath sounds: Normal breath sounds.  Abdominal:     General: Abdomen is flat. Bowel sounds are normal.     Tenderness: There is no abdominal tenderness. There is no guarding.  Musculoskeletal:        General: Swelling and tenderness present.     Left shoulder: Normal.     Left elbow: He exhibits decreased range of motion and swelling. He exhibits no effusion. No tenderness found.     Left wrist: Normal.     Left upper arm: He exhibits tenderness and swelling.     Left forearm: He exhibits swelling.       Arms:     Left hand: Normal. He exhibits normal capillary refill. Normal sensation noted. Normal strength noted.  Skin:    General: Skin is warm and dry.     Capillary Refill: Capillary refill takes less than 2 seconds.  Neurological:     General: No focal deficit present.     Mental Status: He is alert and oriented to person, place, and time.  Psychiatric:        Mood and Affect: Mood normal.        Behavior: Behavior normal.          ED Treatments / Results  Labs (all labs ordered are listed, but only abnormal results are displayed) Labs  Reviewed - No data to display  EKG None  Radiology Dg Humerus Left  Result Date: 05/30/2019 CLINICAL DATA:  Arm injury after exercising. EXAM: LEFT HUMERUS - 2+ VIEW COMPARISON:  None. FINDINGS: Degenerative changes in the left [facility] and glenohumeral joints, most pronounced in the left [facility] joint with joint space narrowing and spurring. No acute bony abnormality. Specifically, no fracture, subluxation, or dislocation. IMPRESSION: No acute bony abnormality. Electronically Signed   By: Charlett Nose M.D.   On: 05/30/2019 21:58    Procedures Procedures (  including critical care time)  Medications Ordered in ED Medications  acetaminophen (TYLENOL) tablet 1,000 mg (1,000 mg Oral Given 05/31/19 0228)  iohexol (OMNIPAQUE) 300 MG/ML solution 100 mL (100 mLs Intravenous Contrast Given 05/31/19 0450)  fentaNYL (SUBLIMAZE) injection 50 mcg (50 mcg Intravenous Given 05/31/19 [id number])    620 am case d/w Dr. Roda ShuttersXu who will see the patient in consult in the ED  Dr. Roda ShuttersXu was consulted as the arm is worsening and over concerned for an early compartment syndrome.  Though this is unlikely the arm has worsened since initially evaluation.  No active extravasation on CT but they were unable to assess the biceps tendon.  This is difficult at this time secondary to swelling.  Arm is elevated on stand and iced.  Patient is resting comfortably pending evaluation by orthopedics.    Initial Impression / Assessment and Plan / ED Course    Tom [name] was evaluated in Emergency Department on 05/31/2019 for the symptoms described in the history of present illness. He was evaluated in the context of the global COVID-19 pandemic, which necessitated consideration that the patient might be at risk for infection with the SARS-CoV-2 virus that causes COVID-19. Institutional protocols and algorithms that pertain to the evaluation of patients at risk for COVID-19 are in a state of rapid change based on information released by regulatory  bodies including the CDC and federal and state organizations. These policies and algorithms were followed during the patient's care in the ED.   Final Clinical Impressions(s) / ED Diagnoses   Signed out to Dr. Jeraldine LootsLockwood pending orthopedics evaluation    Shraddha Lebron, MD 05/31/19 [phone]

## 2019-05-31 NOTE — Consult Note (Signed)
ORTHOPAEDIC CONSULTATION  REQUESTING PHYSICIAN: Carmin Muskrat, MD  Chief Complaint: Left upper arm swelling and ecchymosis  HPI: Tom [name] is a 63 y.o. male who presents with acute onset and worsening of left upper arm swelling ecchymosis for the last 6 to 12 hours.  He states that he did some upper extremity exercises yesterday and then he noticed bruise and swelling and discomfort near the upper portion of his left biceps muscle belly.  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  He denies feeling a pop or any specific injuries.  Denies any paresthesias.  Denies any severe pain.  He states that it is more of a throbbing aching pain.  Past Medical History:  Diagnosis Date  . Anxiety   . Hypertension   . Multiple allergies   . PE (pulmonary thromboembolism) ([place]) 10/2015   Past Surgical History:  Procedure Laterality Date  . COLONOSCOPY WITH PROPOFOL Left 11/16/2016   Procedure: COLONOSCOPY WITH PROPOFOL;  Surgeon: Ronnette Juniper, MD;  Location: [place] [place];  Service: Gastroenterology;  Laterality: Left;  . KNEE CARTILAGE SURGERY Right 1972  . MOUTH SURGERY     Social History   Socioeconomic History  . Marital status: Divorced    Spouse name: Not on file  . Number of children: Not on file  . Years of education: Not on file  . Highest education level: Not on file  Occupational History  . Not on file  Social Needs  . Financial resource strain: Not on file  . Food insecurity    Worry: Not on file    Inability: Not on file  . Transportation needs    Medical: Not on file    Non-medical: Not on file  Tobacco Use  . Smoking status: Never Smoker  . Smokeless tobacco: Never Used  Substance and Sexual Activity  . Alcohol use: Yes    Alcohol/week: 1.0 standard drinks    Types: 1 Cans of beer per week    Comment: weekly  . Drug use: No  . Sexual activity: Never  Lifestyle  . Physical activity    Days per week: Not on file    Minutes per session: Not on file   . Stress: Not on file  Relationships  . Social Herbalist on phone: Not on file    Gets together: Not on file    Attends religious service: Not on file    Active member of club or organization: Not on file    Attends meetings of clubs or organizations: Not on file    Relationship status: Not on file  Other Topics Concern  . Not on file  Social History Narrative  . Not on file   No family history on file. - negative except otherwise stated in the family history section Allergies  Allergen Reactions  . Ampicillin Other (See Comments)    Childhood; Unsure of reaction.  . Sulfa Antibiotics Other (See Comments)    unspecified   Prior to Admission medications   Medication Sig Start Date End Date Taking? Authorizing Provider  budesonide-formoterol (SYMBICORT) 160-4.5 MCG/ACT inhaler Inhale 2 puffs into the lungs every evening. 05/12/19 06/11/19 Yes [provider]  losartan (COZAAR) 50 MG tablet Take 50 mg by mouth daily. 11/01/16  Yes [provider]  XARELTO 10 MG TABS tablet Take 10 mg by mouth daily. 04/28/19  Yes [provider]  albuterol (PROVENTIL) (2.5 MG/3ML) 0.083% nebulizer solution Take 3 mLs (2.5 mg total) by nebulization every  4 (four) hours as needed for wheezing or shortness of breath. Patient not taking: Reported on 05/31/2019 11/17/16   Arvilla Market, DO  benzonatate (TESSALON) 100 MG capsule Take 1 capsule (100 mg total) by mouth 3 (three) times daily as needed for cough. Patient not taking: Reported on 05/31/2019 11/17/16   Arvilla Market, DO  diazepam (VALIUM) 5 MG tablet Take 1 tablet (5 mg total) by mouth 2 (two) times daily as needed for anxiety. Patient not taking: Reported on 05/31/2019 11/09/16   Freddrick March, MD  famotidine (PEPCID) 20 MG tablet Take 1 tablet (20 mg total) by mouth 2 (two) times daily. Patient not taking: Reported on 05/31/2019 11/17/16   Arvilla Market, DO  Rivaroxaban  (XARELTO) 15 MG TABS tablet Take 1 tablet (15 mg total) by mouth 2 (two) times daily with a meal. Patient not taking: Reported on 05/31/2019 11/17/16   Arvilla Market, DO  rivaroxaban (XARELTO) 20 MG TABS tablet Take 1 tablet (20 mg total) by mouth daily with supper. Patient not taking: Reported on 05/31/2019 12/07/16   Arvilla Market, DO   Dg Humerus Left  Result Date: 05/30/2019 CLINICAL DATA:  Arm injury after exercising. EXAM: LEFT HUMERUS - 2+ VIEW COMPARISON:  None. FINDINGS: Degenerative changes in the left [facility] and glenohumeral joints, most pronounced in the left [facility] joint with joint space narrowing and spurring. No acute bony abnormality. Specifically, no fracture, subluxation, or dislocation. IMPRESSION: No acute bony abnormality. Electronically Signed   By: Charlett Nose M.D.   On: 05/30/2019 21:58   - pertinent xrays, CT, MRI studies were reviewed and independently interpreted  Positive ROS: All other systems have been reviewed and were otherwise negative with the exception of those mentioned in the HPI and as above.  Physical Exam: General: Alert, no acute distress Cardiovascular: No pedal edema Respiratory: No cyanosis, no use of accessory musculature GI: No organomegaly, abdomen is soft and non-tender Skin: No lesions in the area of chief complaint Neurologic: Sensation intact distally Psychiatric: Patient is competent for consent with normal mood and affect Lymphatic: No axillary or cervical lymphadenopathy  MUSCULOSKELETAL:  Left upper arm shows moderate swelling and significant ecchymosis.  Muscle compartments are soft and there is no pain to passive stretch.  Triceps function intact without pain.  Axillary folds are symmetric.  Pectoralis function is normal without pain.  There is mild discomfort with resisted elbow flexion.  He has moderately severe discomfort with resisted forearm supination.  He is most tender to the proximal aspect of the biceps muscle  belly.  Due to the swelling of Popeye deformity cannot be detected.  Is good range of motion of the shoulder and elbow and wrist without pain.  He is sitting comfortably in the stretcher in a blue arm elevator without any outward signs of severe pain consistent with compartment syndrome.  Assessment: Left proximal biceps tear likely in the myotendinous junction exacerbated by Xarelto  Plan: Reassurance was provided that exam is not consistent with compartment syndrome.  Compression wrap was applied from wrist to shoulder.  He is to continue arm elevator at home.  He is to use ice for the first several days and then transition to heat 30 minutes at a time.  Tylenol as needed.  This will likely take 6 to 8 weeks for full resolution.  Recommend follow-up in the office in about a month for recheck and possibly some physical therapy.  Thank you for the consult and the opportunity to  see Tom [name]  N. Glee ArvinMichael Inessa Wardrop, MD [facility] [place] 9:36 AM

## 2019-05-31 NOTE — Discharge Instructions (Signed)
Please be sure to follow your orthopedist's instructions, and follow-up with him as directed.

## 2019-05-31 NOTE — ED Notes (Signed)
Patient verbalizes understanding of discharge instructions. Opportunity for questioning and answers were provided. Armband removed by staff, pt discharged from ED.  

## 2021-06-28 IMAGING — CT CT EXTREM UP ENTIRE ARM*L* W/ CM
3 of 4 series · 8 of 33 positions shown, 9 images · IV contrast (omnipaque)
Comparison: None.

CLINICAL DATA: Left biceps pain and bruising after exercise

EXAM:
CT OF THE UPPER LEFT EXTREMITY WITH CONTRAST
TECHNIQUE: Multidetector CT imaging of the left upper extremity was performed
according to the standard protocol following intravenous contrast
administration.
CONTRAST:  100mL OMNIPAQUE IOHEXOL 300 MG/ML  SOLN

[Series 4: extremity soft tissue · axial · 0.56mm/px · z∈[-1090,-682]mm · 2 of 442 slices shown, 3 images]
[im 136/442  soft-tissue]
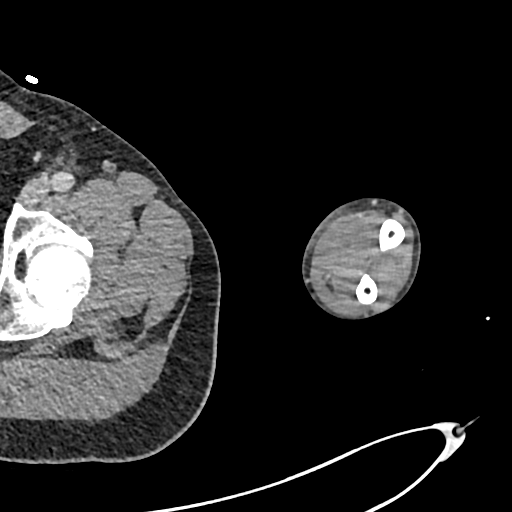
[im 136/442  bone]
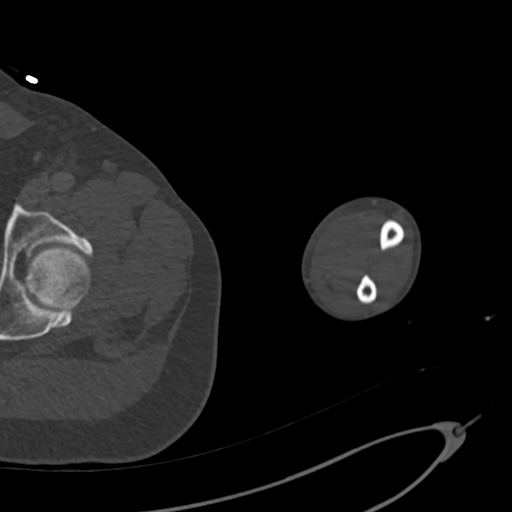
[im 340/442  bone]
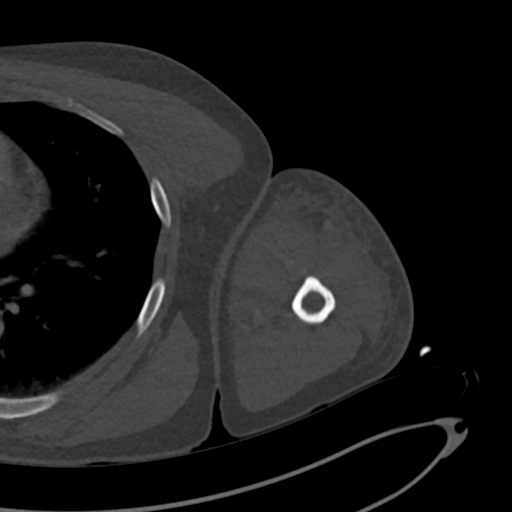

[Series 6: cor bone · coronal · 0.59mm/px · 1 of 150 slices shown]
[im 75/150  bone]
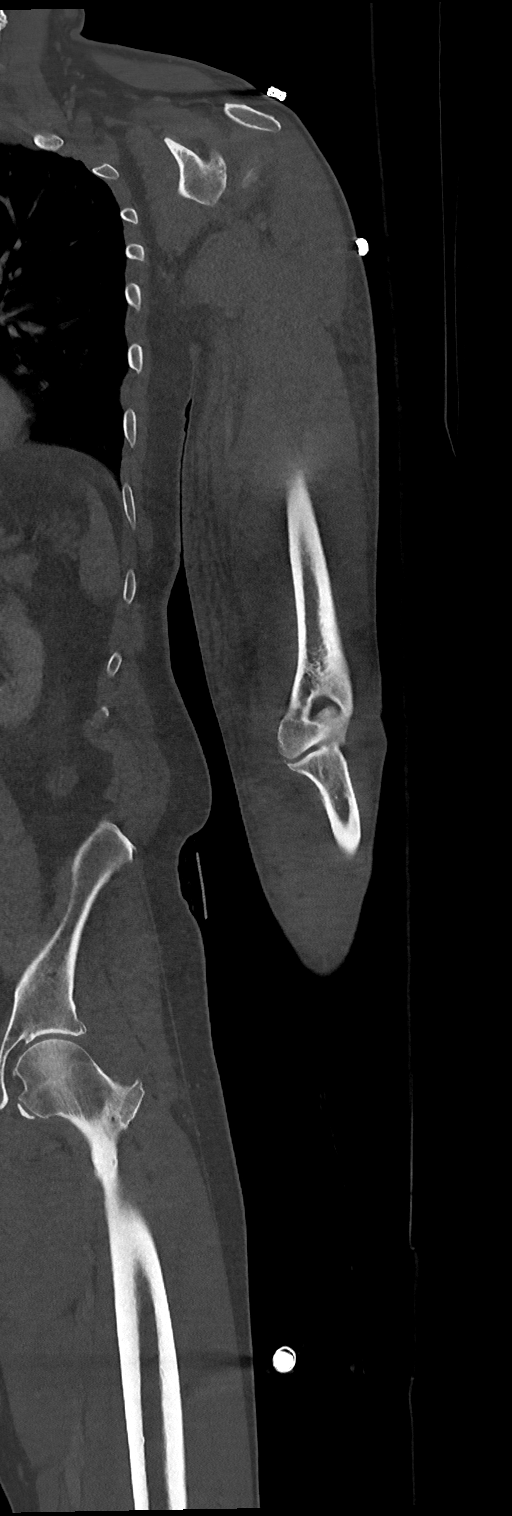

[Series 9: sag soft tissue · sagittal · 0.61mm/px · 5 of 124 slices shown]
[im 42/124  bone]
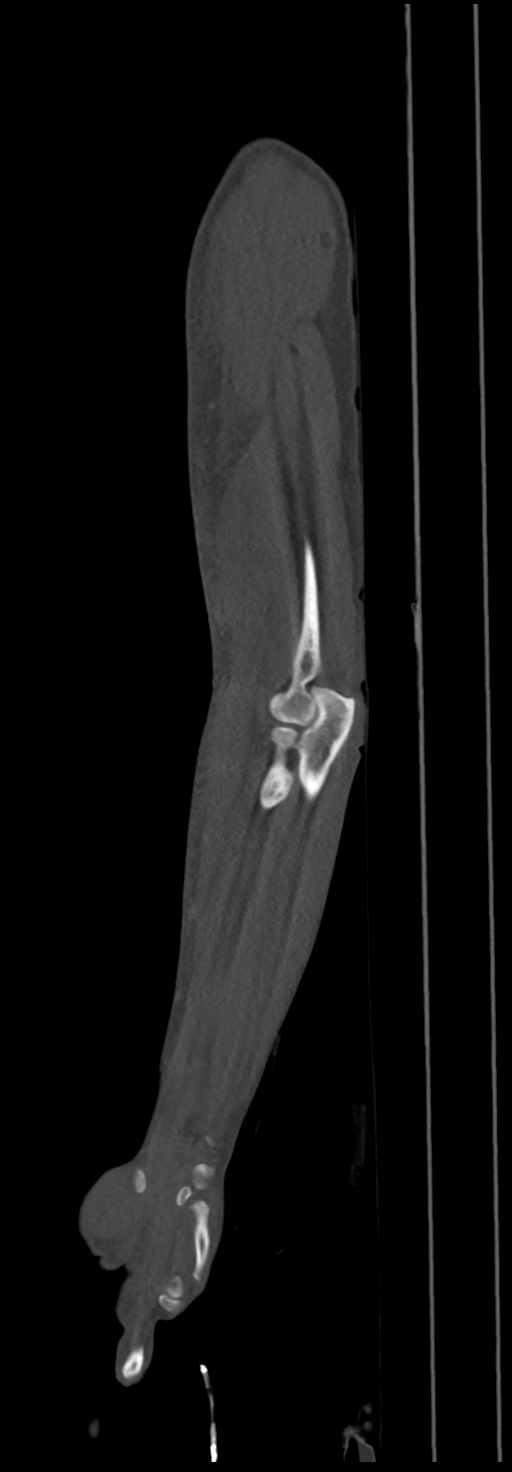
[im 52/124  bone]
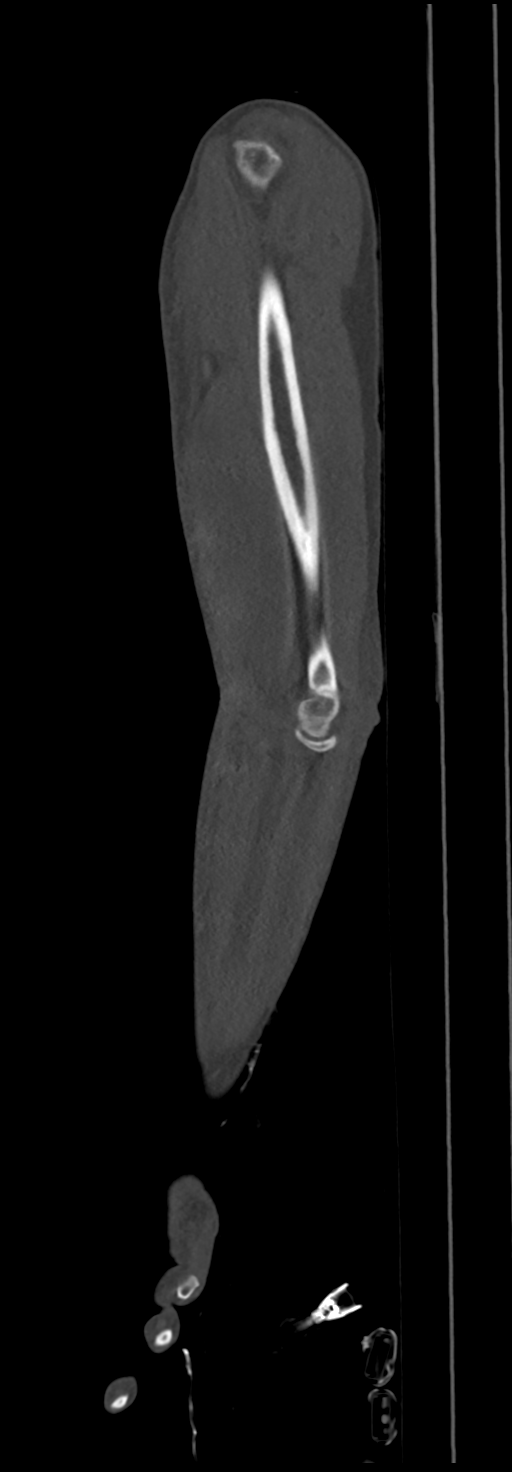
[im 62/124  bone]
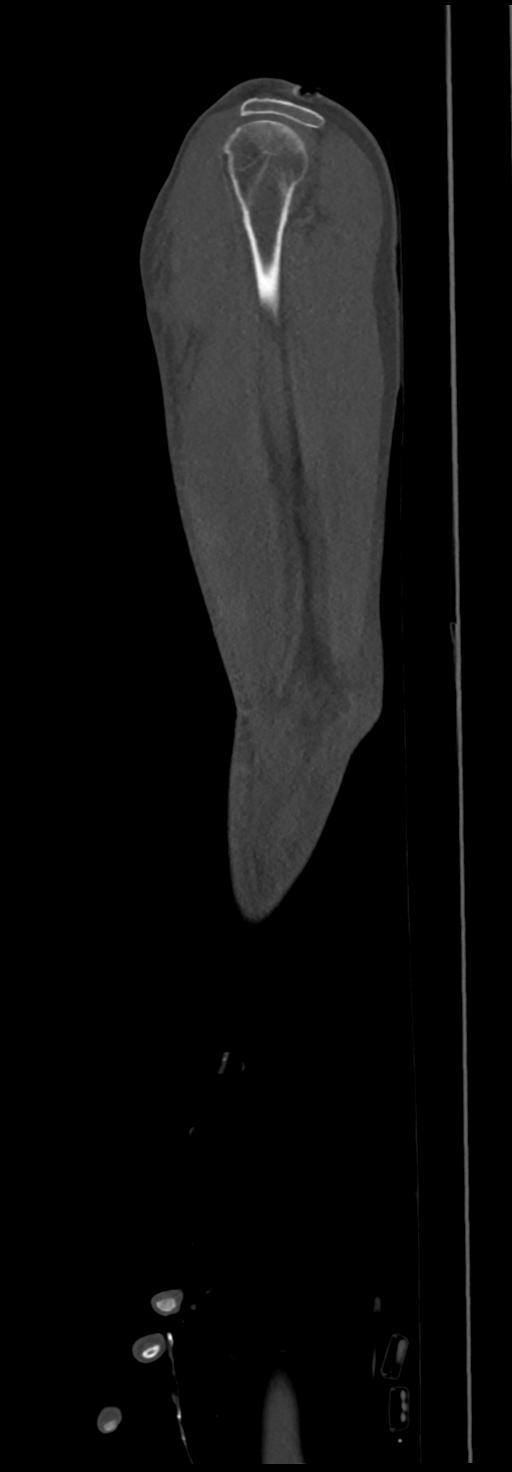
[im 72/124  bone]
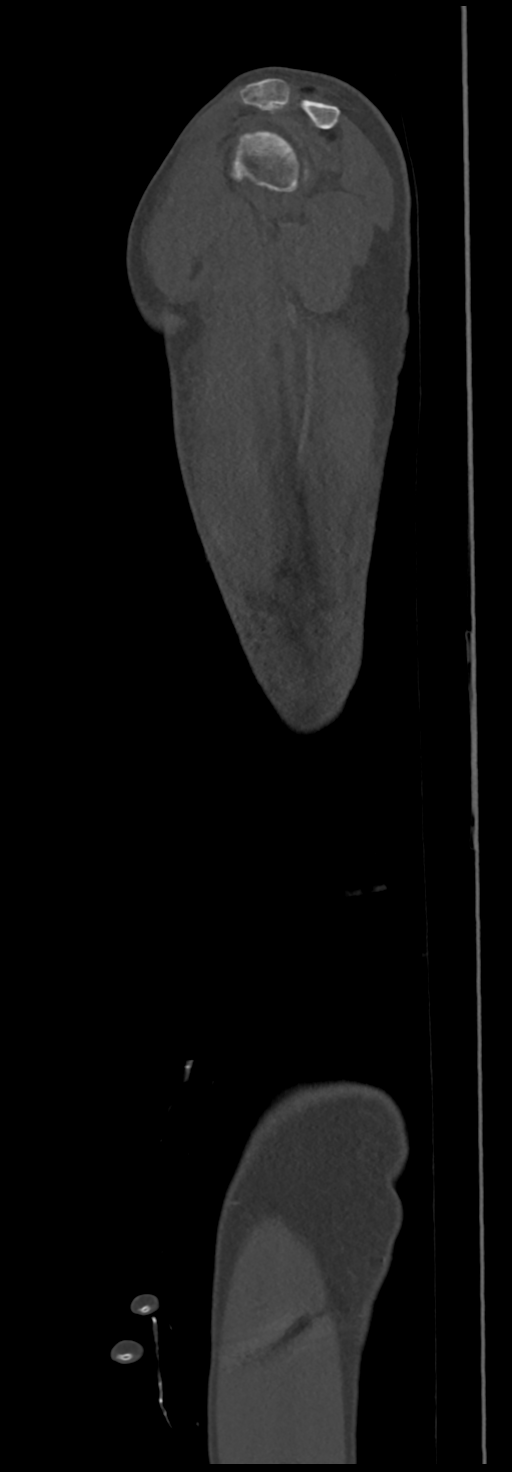
[im 83/124  bone]
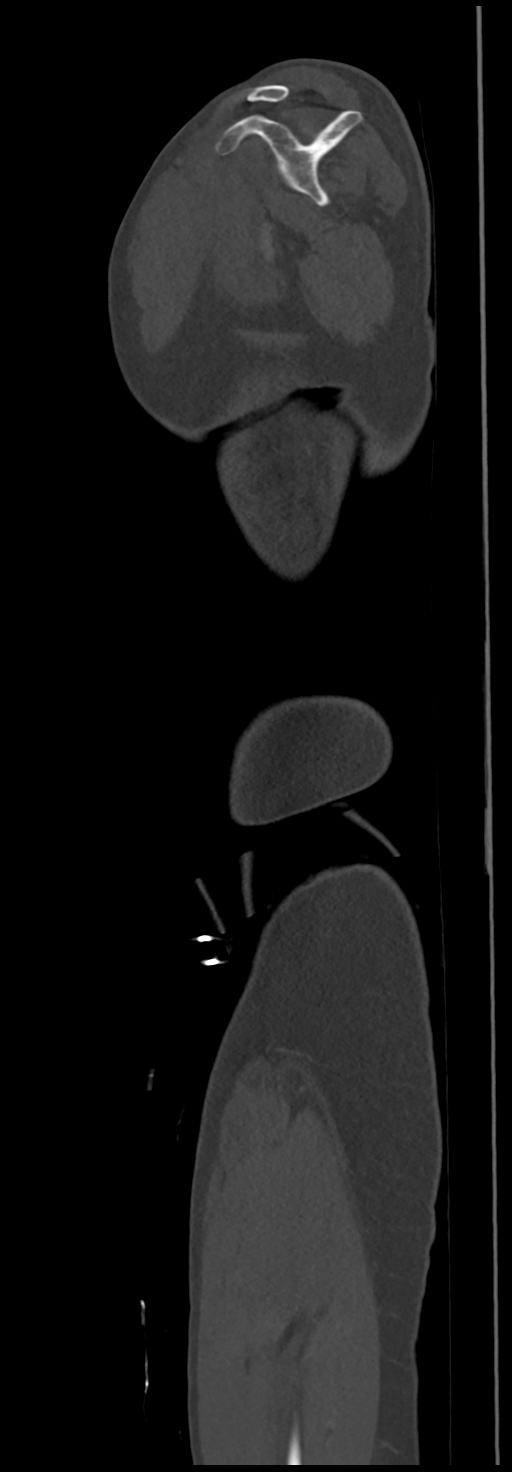

[8 of 33 positions shown; findings below may reference images not displayed]

FINDINGS: Bones/Joint/Cartilage

There is no fracture

Ligaments

Suboptimally assessed by CT.

Muscles and Tendons

There is subcutaneous edema overlying the left deltoid and biceps
brachii muscles. There is no visible intramuscular collection. The
subcutaneous edema extends into the forearm.

Soft tissues

The left upper extremity vessels are normal. The visible portions of
the chest and abdomen are unremarkable.
IMPRESSION: Subcutaneous edema throughout the left upper extremity,
predominantly overlying the left biceps brachii and deltoid muscles.
MRI is recommended to better assess for and intra muscular or
tendinous injury.

## 2021-08-10 ENCOUNTER — Encounter (INDEPENDENT_AMBULATORY_CARE_PROVIDER_SITE_OTHER): Payer: Self-pay

## 2021-08-15 ENCOUNTER — Encounter (INDEPENDENT_AMBULATORY_CARE_PROVIDER_SITE_OTHER): Payer: Medicare Other | Admitting: Ophthalmology

## 2021-09-07 ENCOUNTER — Ambulatory Visit (INDEPENDENT_AMBULATORY_CARE_PROVIDER_SITE_OTHER): Payer: Medicare Other | Admitting: Ophthalmology

## 2021-09-07 ENCOUNTER — Encounter (INDEPENDENT_AMBULATORY_CARE_PROVIDER_SITE_OTHER): Payer: Self-pay | Admitting: Ophthalmology

## 2021-09-07 ENCOUNTER — Other Ambulatory Visit: Payer: Self-pay

## 2021-09-07 DIAGNOSIS — H43391 Other vitreous opacities, right eye: Secondary | ICD-10-CM | POA: Insufficient documentation

## 2021-09-07 DIAGNOSIS — H43812 Vitreous degeneration, left eye: Secondary | ICD-10-CM

## 2021-09-07 DIAGNOSIS — H2513 Age-related nuclear cataract, bilateral: Secondary | ICD-10-CM | POA: Insufficient documentation

## 2021-09-07 DIAGNOSIS — H43392 Other vitreous opacities, left eye: Secondary | ICD-10-CM | POA: Insufficient documentation

## 2021-09-07 DIAGNOSIS — H43811 Vitreous degeneration, right eye: Secondary | ICD-10-CM | POA: Diagnosis not present

## 2021-09-07 NOTE — Progress Notes (Signed)
09/07/2021     CHIEF COMPLAINT Patient presents for  Chief Complaint  Patient presents with   Retina Evaluation      HISTORY OF PRESENT ILLNESS: Tom [name] is a 66 y.o. male who presents to the clinic today for:   HPI     Retina Evaluation           Laterality: both eyes   Onset: years ago   Associated Symptoms: Flashes, Floaters and Glare.  Negative for Photophobia         Comments   NP- floaters in visual field bilateral, referred by Dr. Nile Riggs. "I notice bubbles in my eyes, some look like whisps, some look like dark spots, some are clumped together. Not as many as a snow globe, but several clusters move together. Occasionally I have been seeing a bright flash or two. Driving here, in the bright sunlight I saw bright circular lights. I have had small ones since 2015. Over the years the numbers has increased gradually. Now over since 2022, they have increased even more."       Last edited by Nelva Nay on 09/07/2021  2:46 PM.      Referring physician: Jethro Bolus, MD [address] [place],  [place] [address]  HISTORICAL INFORMATION:   Selected notes from the MEDICAL RECORD NUMBER       CURRENT MEDICATIONS: No current outpatient medications on file. (Ophthalmic Drugs)   No current facility-administered medications for this visit. (Ophthalmic Drugs)   Current Outpatient Medications (Other)  Medication Sig   albuterol (PROVENTIL) (2.5 MG/3ML) 0.083% nebulizer solution Take 3 mLs (2.5 mg total) by nebulization every 4 (four) hours as needed for wheezing or shortness of breath. (Patient not taking: Reported on 05/31/2019)   benzonatate (TESSALON) 100 MG capsule Take 1 capsule (100 mg total) by mouth 3 (three) times daily as needed for cough. (Patient not taking: Reported on 05/31/2019)   budesonide-formoterol (SYMBICORT) 160-4.5 MCG/ACT inhaler Inhale 2 puffs into the lungs every evening.   diazepam (VALIUM) 5 MG tablet Take 1 tablet (5 mg total)  by mouth 2 (two) times daily as needed for anxiety. (Patient not taking: Reported on 05/31/2019)   famotidine (PEPCID) 20 MG tablet Take 1 tablet (20 mg total) by mouth 2 (two) times daily. (Patient not taking: Reported on 05/31/2019)   losartan (COZAAR) 50 MG tablet Take 50 mg by mouth daily.   Rivaroxaban (XARELTO) 15 MG TABS tablet Take 1 tablet (15 mg total) by mouth 2 (two) times daily with a meal. (Patient not taking: Reported on 05/31/2019)   rivaroxaban (XARELTO) 20 MG TABS tablet Take 1 tablet (20 mg total) by mouth daily with supper. (Patient not taking: Reported on 05/31/2019)   XARELTO 10 MG TABS tablet Take 10 mg by mouth daily.   No current facility-administered medications for this visit. (Other)      REVIEW OF SYSTEMS:    ALLERGIES Allergies  Allergen Reactions   Ampicillin Other (See Comments)    Childhood; Unsure of reaction.   Sulfa Antibiotics Other (See Comments)    unspecified    PAST MEDICAL HISTORY Past Medical History:  Diagnosis Date   Anxiety    Hypertension    Multiple allergies    PE (pulmonary thromboembolism) (HCC) 10/2015   Past Surgical History:  Procedure Laterality Date   COLONOSCOPY WITH PROPOFOL Left 11/16/2016   Procedure: COLONOSCOPY WITH PROPOFOL;  Surgeon: Kerin Salen, MD;  Location: [facility] ENDOSCOPY;  Service: Gastroenterology;  Laterality: Left;  KNEE CARTILAGE SURGERY Right 1972   MOUTH SURGERY      FAMILY HISTORY History reviewed. No pertinent family history.  SOCIAL HISTORY Social History   Tobacco Use   Smoking status: Never   Smokeless tobacco: Never  Vaping Use   Vaping Use: Never used  Substance Use Topics   Alcohol use: Yes    Alcohol/week: 1.0 standard drink    Types: 1 Cans of beer per week    Comment: weekly   Drug use: No         OPHTHALMIC EXAM:  Base Eye Exam     Visual Acuity (ETDRS)       Right Left   Dist [place] 20/20 -2 20/20         Tonometry     Unable to assess: Yes         Pupils        Pupils Dark Light APD   Right PERRL 4 3 None   Left PERRL 4 3 None         Visual Fields (Counting fingers)       Left Right    Full Full         Extraocular Movement       Right Left    Full Full         Neuro/Psych     Oriented x3: Yes   Mood/Affect: Normal         Dilation     Both eyes: 1.0% Mydriacyl, 2.5% Phenylephrine @ 2:55 PM           Slit Lamp and Fundus Exam     External Exam       Right Left   External Normal Normal         Slit Lamp Exam       Right Left   Lids/Lashes Normal Normal   Conjunctiva/Sclera White and quiet White and quiet   Cornea Clear Clear   Anterior Chamber Deep and quiet Deep and quiet   Iris Round and reactive Round and reactive   Lens 2+ Nuclear sclerosis 2+ Nuclear sclerosis   Anterior Vitreous Normal Normal         Fundus Exam       Right Left   Posterior Vitreous Posterior vitreous detachment, Central vitreous floaters Posterior vitreous detachment, Central vitreous floaters   Disc Normal Normal   C/D Ratio 0.25 0.25   Macula Normal Normal   Vessels Normal Normal   Periphery No holes or tears, with scleral depression, 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  Finely and highly circumscribed borders to this area. No holes or tears left eye, area pigmentary change at the 5:00 meridian anterior, scleral depression no bright            IMAGING AND PROCEDURES  Imaging and Procedures for 09/07/21  Color Fundus Photography Optos - OU - Both Eyes       Right Eye Progression has no prior data. Disc findings include normal observations. Macula : normal observations. Vessels : normal observations. Periphery : normal observations.   Left Eye Progression has no prior data. Disc findings include normal observations. Macula : normal observations. Vessels : normal  observations. Periphery : normal observations.   Notes Congenital hypertrophy of the RPE temporally, 9 meridian OD  Red free photos disclosed significant amounts of vitreous debris  and is that it is coincident with the patient's complaint      OCT, Retina - OU - Both Eyes       Right Eye Quality was good. Scan locations included subfoveal. Central Foveal Thickness: 270. Progression has been stable. Findings include normal foveal contour.   Left Eye Quality was good. Scan locations included subfoveal. Central Foveal Thickness: 289. Progression has been stable. Findings include normal foveal contour.              ASSESSMENT/PLAN:  Nuclear sclerotic cataract of both eyes The nature of cataract was discussed with the patient as well as the elective nature of surgery. The patient was reassured that surgery at a later date does not put the patient at risk for a worse outcome. It was emphasized that the need for surgery is dictated by the patient's quality of life as influenced by the cataract. Patient was instructed to maintain close follow up with their general eye care doctor.  OU likely affecting his nighttime or daytime driving with dark rainy days.  2+ NSC changes with clouding likely is what is a 48 neutral density filter with color and that is actually making his current floaters more pronounced as the cataracts progress.  In the absence of impact of activities of daily living I recommend continued observation of the cataracts but if he can do fine impact of these cataracts and the color change upon enjoyable activities in his life he could consider further testing with Dr. Jethro Bolus for consideration of catheter extraction intraocular lens placement in each eye  I explained to him the potential evaluation of glare testing to see if normal ambient lighting is is really knocking his vision down further than he knows  Vitreous floaters of left eye Central vitreous debris or  floaters OS, highly defined and seen on red free photographs, most likely in his case more pronounced due to the presence of cataract and its progression  Vitreous floaters of right eye OD centrally with central vitreous floaters, no pathology peripherally, no retinal holes or tears.  Again see the comments regarding the vitreous floaters in the left eye likely made worse by progressive NSC changes over time of a shared personal experience in this regard regarding my own cataract development with my own floaters  Posterior vitreous detachment of left eye No peripheral pathology left eye     ICD-10-CM   1. Posterior vitreous detachment of right eye  H43.811 OCT, Retina - OU - Both Eyes    2. Vitreous floaters of right eye  H43.391 Color Fundus Photography Optos - OU - Both Eyes    OCT, Retina - OU - Both Eyes    3. Vitreous floaters of left eye  H43.392 Color Fundus Photography Optos - OU - Both Eyes    4. Nuclear sclerotic cataract of both eyes  H25.13     5. Posterior vitreous detachment of left eye  H43.812       1.  OU with vitreous floaters, which reassured the patient there is currently no retinal holes or tears.  2.  OU with NSC changes which are likely with continued darkening due to aging, causing a more pronounced symptomatology is produced by his current plethora vitreous floaters.  I explained to the patient that surgical intervention for floater should only be taken undertaken if impact on activities of daily living occur.  Moreover patient only be undertaken in his case in this age group after routine cataract surgery is performed  so as to determine whether or not cataract surgery itself will diminish his symptomatology and thereby potentially avoiding vitrectomy surgery.  Moreover I explained the fact that vitrectomy surgery is best carried out post cataract surgery so as to remove the anterior hyaloid which also can be the domain of vitreous floaters potentially the most  symptomatic  3.  OU, recommend follow-up with Dr. Jethro BolusMark Shapiro in a nonurgent basis simply to evaluate the extent that glare may be playing in his current nuclear sclerotic cataract situation.  4.  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      Return if symptoms worsen or fail to improve, for Follow-up Dr. Jethro BolusMark Shapiro as scheduled.  There are no Patient Instructions on file for this visit.   Explained the diagnoses, plan, and follow up with the patient and they expressed understanding.  Patient expressed understanding of the importance of proper follow up care.   Alford HighlandGary A. Lillianne Eick M.D. Diseases & Surgery of the Retina and Vitreous [facility] 09/07/21     Abbreviations: M myopia (nearsighted); A astigmatism; H hyperopia (farsighted); P presbyopia; Mrx spectacle prescription;  CTL contact lenses; OD right eye; OS left eye; OU both eyes  XT exotropia; ET esotropia; PEK punctate epithelial keratitis; PEE punctate epithelial erosions; DES dry eye syndrome; MGD meibomian gland dysfunction; ATs artificial tears; PFAT's preservative free artificial tears; NSC nuclear sclerotic cataract; PSC posterior subcapsular cataract; ERM epi-retinal membrane; PVD posterior vitreous detachment; RD retinal detachment; DM diabetes mellitus; DR diabetic retinopathy; NPDR non-proliferative diabetic retinopathy; PDR proliferative diabetic retinopathy; CSME clinically significant macular edema; DME diabetic macular edema; dbh dot blot hemorrhages; CWS cotton wool spot; POAG primary open angle glaucoma; C/D cup-to-disc ratio; HVF humphrey visual field; GVF goldmann visual field; OCT optical coherence tomography; IOP intraocular pressure; BRVO Branch retinal vein occlusion; CRVO central retinal vein occlusion; CRAO central retinal artery occlusion; BRAO branch  retinal artery occlusion; RT retinal tear; SB scleral buckle; PPV pars plana vitrectomy; VH Vitreous hemorrhage; PRP panretinal laser photocoagulation; IVK intravitreal kenalog; VMT vitreomacular traction; MH Macular hole;  NVD neovascularization of the disc; NVE neovascularization elsewhere; AREDS age related eye disease study; ARMD age related macular degeneration; POAG primary open angle glaucoma; EBMD epithelial/anterior basement membrane dystrophy; ACIOL anterior chamber intraocular lens; IOL intraocular lens; PCIOL posterior chamber intraocular lens; Phaco/IOL phacoemulsification with intraocular lens placement; PRK photorefractive keratectomy; LASIK laser assisted in situ keratomileusis; HTN hypertension; DM diabetes mellitus; COPD chronic obstructive pulmonary disease

## 2021-09-07 NOTE — Assessment & Plan Note (Signed)
OD centrally with central vitreous floaters, no pathology peripherally, no retinal holes or tears. ? ?Again see the comments regarding the vitreous floaters in the left eye likely made worse by progressive NSC changes over time of a shared personal experience in this regard regarding my own cataract development with my own floaters ?

## 2021-09-07 NOTE — Assessment & Plan Note (Signed)
The nature of cataract was discussed with the patient as well as the elective nature of surgery. The patient was reassured that surgery at a later date does not put the patient at risk for a worse outcome. It was emphasized that the need for surgery is dictated by the patient's quality of life as influenced by the cataract. Patient was instructed to maintain close follow up with their general eye care doctor. ? ?OU likely affecting his nighttime or daytime driving with dark rainy days.  2+ NSC changes with clouding likely is what is a 48 neutral density filter with color and that is actually making his current floaters more pronounced as the cataracts progress. ? ?In the absence of impact of activities of daily living I recommend continued observation of the cataracts but if he can do fine impact of these cataracts and the color change upon enjoyable activities in his life he could consider further testing with Dr. Rutherford Guys for consideration of catheter extraction intraocular lens placement in each eye ? ?I explained to him the potential evaluation of glare testing to see if normal ambient lighting is is really knocking his vision down further than he knows ?

## 2021-09-07 NOTE — Assessment & Plan Note (Signed)
No peripheral pathology left eye ?

## 2021-09-07 NOTE — Assessment & Plan Note (Signed)
Central vitreous debris or floaters OS, highly defined and seen on red free photographs, most likely in his case more pronounced due to the presence of cataract and its progression ?
